# Patient Record
Sex: Male | Born: 1960 | Hispanic: No | Marital: Married | Smoking: Never smoker
Health system: Southern US, Community
[De-identification: ages and names within clinical notes are randomized; demographics above are authoritative.]

## PROBLEM LIST (undated history)

## (undated) DIAGNOSIS — J159 Unspecified bacterial pneumonia: Secondary | ICD-10-CM

## (undated) DIAGNOSIS — G4730 Sleep apnea, unspecified: Secondary | ICD-10-CM

## (undated) DIAGNOSIS — M1990 Unspecified osteoarthritis, unspecified site: Secondary | ICD-10-CM

## (undated) DIAGNOSIS — I313 Pericardial effusion (noninflammatory): Secondary | ICD-10-CM

## (undated) DIAGNOSIS — M51369 Other intervertebral disc degeneration, lumbar region without mention of lumbar back pain or lower extremity pain: Secondary | ICD-10-CM

## (undated) DIAGNOSIS — J309 Allergic rhinitis, unspecified: Secondary | ICD-10-CM

## (undated) DIAGNOSIS — I319 Disease of pericardium, unspecified: Secondary | ICD-10-CM

## (undated) DIAGNOSIS — M5136 Other intervertebral disc degeneration, lumbar region: Secondary | ICD-10-CM

## (undated) DIAGNOSIS — I10 Essential (primary) hypertension: Secondary | ICD-10-CM

## (undated) HISTORY — DX: Other intervertebral disc degeneration, lumbar region: M51.36

## (undated) HISTORY — DX: Other intervertebral disc degeneration, lumbar region without mention of lumbar back pain or lower extremity pain: M51.369

## (undated) HISTORY — PX: CHOLECYSTECTOMY: SHX55

## (undated) HISTORY — DX: Allergic rhinitis, unspecified: J30.9

---
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
## 2015-02-25 ENCOUNTER — Other Ambulatory Visit: Payer: Self-pay | Admitting: Physical Medicine and Rehabilitation

## 2015-02-25 DIAGNOSIS — M4800 Spinal stenosis, site unspecified: Secondary | ICD-10-CM

## 2015-03-08 ENCOUNTER — Other Ambulatory Visit: Payer: Self-pay

## 2016-12-10 ENCOUNTER — Encounter (HOSPITAL_BASED_OUTPATIENT_CLINIC_OR_DEPARTMENT_OTHER): Payer: Self-pay | Admitting: *Deleted

## 2016-12-13 ENCOUNTER — Ambulatory Visit: Payer: Self-pay | Admitting: Ophthalmology

## 2016-12-13 NOTE — H&P (Signed)
Date of examination:  11-29-16  Indication for surgery: to straighten the eyes and allow some binocularity  Pertinent past medical history:  Past Medical History:  Diagnosis Date  . Arthritis   . Hypertension   . Sleep apnea     Pertinent ocular history:  Hx strabismus surgery x3, most recently an adjustable for ET about 1993, gradual recurrence of ET over the years  Pertinent family history: No family history on file.  General:  Healthy appearing patient in no distress.    Eyes:    Acuity  cc OD 20/20  OS 20/20  External: Within normal limits     Anterior segment: Within normal limits x healed conj scars  Motility:   ET=20, increases in R gaze decreases in L gaze, 1-2- RLR.  ET' 22.  RHT 6 comitant  Fundus: deferred  Refraction:  Manifest Cycloplegic OD +1  OS -2.50  Heart: Regular rate and rhythm without murmur     Lungs: Clear to auscultation     Impression: Esotropia, recurrent (?consecutive), s/p strabismus surgery x 3, details unknown, horizontally incomitant  Plan: Explore at least the right lateral rectus muscle and left medial rectus muscle.  Tentatively planning to recess left medial rectus, ?adjustable, and may also (or instead) advance/resect right lateral rectus muscle, ? Adjustable.  If advancing/resecting RLR may explore RMR to make sure it has been recessed  Shara Blazing

## 2016-12-16 ENCOUNTER — Encounter (HOSPITAL_BASED_OUTPATIENT_CLINIC_OR_DEPARTMENT_OTHER)
Admission: RE | Admit: 2016-12-16 | Discharge: 2016-12-16 | Disposition: A | Discharge status: Home / Self Care | Payer: BLUE CROSS/BLUE SHIELD | Source: Ambulatory Visit | Attending: Ophthalmology | Admitting: Ophthalmology

## 2016-12-16 DIAGNOSIS — I10 Essential (primary) hypertension: Secondary | ICD-10-CM | POA: Diagnosis not present

## 2016-12-16 DIAGNOSIS — H5000 Unspecified esotropia: Secondary | ICD-10-CM | POA: Diagnosis not present

## 2016-12-16 DIAGNOSIS — E669 Obesity, unspecified: Secondary | ICD-10-CM | POA: Diagnosis not present

## 2016-12-16 DIAGNOSIS — G4730 Sleep apnea, unspecified: Secondary | ICD-10-CM | POA: Diagnosis not present

## 2016-12-16 DIAGNOSIS — Z6835 Body mass index (BMI) 35.0-35.9, adult: Secondary | ICD-10-CM | POA: Diagnosis not present

## 2016-12-16 DIAGNOSIS — H11241 Scarring of conjunctiva, right eye: Secondary | ICD-10-CM | POA: Diagnosis not present

## 2016-12-16 DIAGNOSIS — M1990 Unspecified osteoarthritis, unspecified site: Secondary | ICD-10-CM | POA: Diagnosis not present

## 2016-12-16 LAB — BASIC METABOLIC PANEL
Anion gap: 8 (ref 5–15)
BUN: 10 mg/dL (ref 6–20)
CO2: 28 mmol/L (ref 22–32)
Calcium: 8.9 mg/dL (ref 8.9–10.3)
Chloride: 102 mmol/L (ref 101–111)
Creatinine, Ser: 1.12 mg/dL (ref 0.61–1.24)
GFR calc Af Amer: >60 mL/min (ref >60)
GFR calc non Af Amer: >60 mL/min (ref >60)
Glucose, Bld: 106 mg/dL — ABNORMAL HIGH (ref 65–99)
Potassium: 3.5 mmol/L (ref 3.5–5.1)
Sodium: 138 mmol/L (ref 135–145)

## 2016-12-17 ENCOUNTER — Encounter (HOSPITAL_BASED_OUTPATIENT_CLINIC_OR_DEPARTMENT_OTHER)
Admission: RE | Disposition: A | Discharge status: Home / Self Care | Payer: Self-pay | Source: Ambulatory Visit | Attending: Ophthalmology

## 2016-12-17 ENCOUNTER — Encounter (HOSPITAL_BASED_OUTPATIENT_CLINIC_OR_DEPARTMENT_OTHER): Payer: Self-pay | Admitting: *Deleted

## 2016-12-17 ENCOUNTER — Ambulatory Visit (HOSPITAL_BASED_OUTPATIENT_CLINIC_OR_DEPARTMENT_OTHER): Payer: BLUE CROSS/BLUE SHIELD | Admitting: Anesthesiology

## 2016-12-17 ENCOUNTER — Ambulatory Visit (HOSPITAL_BASED_OUTPATIENT_CLINIC_OR_DEPARTMENT_OTHER)
Admission: RE | Admit: 2016-12-17 | Discharge: 2016-12-17 | Disposition: A | Discharge status: Home / Self Care | Payer: BLUE CROSS/BLUE SHIELD | Source: Ambulatory Visit | Attending: Ophthalmology | Admitting: Ophthalmology

## 2016-12-17 DIAGNOSIS — H11241 Scarring of conjunctiva, right eye: Secondary | ICD-10-CM | POA: Insufficient documentation

## 2016-12-17 DIAGNOSIS — I10 Essential (primary) hypertension: Secondary | ICD-10-CM | POA: Insufficient documentation

## 2016-12-17 DIAGNOSIS — H5000 Unspecified esotropia: Secondary | ICD-10-CM | POA: Insufficient documentation

## 2016-12-17 DIAGNOSIS — Z6835 Body mass index (BMI) 35.0-35.9, adult: Secondary | ICD-10-CM | POA: Insufficient documentation

## 2016-12-17 DIAGNOSIS — G4730 Sleep apnea, unspecified: Secondary | ICD-10-CM | POA: Insufficient documentation

## 2016-12-17 DIAGNOSIS — E669 Obesity, unspecified: Secondary | ICD-10-CM | POA: Insufficient documentation

## 2016-12-17 DIAGNOSIS — M1990 Unspecified osteoarthritis, unspecified site: Secondary | ICD-10-CM | POA: Insufficient documentation

## 2016-12-17 HISTORY — DX: Unspecified osteoarthritis, unspecified site: M19.90

## 2016-12-17 HISTORY — DX: Sleep apnea, unspecified: G47.30

## 2016-12-17 HISTORY — PX: STRABISMUS SURGERY: SHX218

## 2016-12-17 HISTORY — DX: Essential (primary) hypertension: I10

## 2016-12-17 SURGERY — STRABISMUS SURGERY, BILATERAL
Anesthesia: General | Site: Eye | Laterality: Bilateral

## 2016-12-17 MED ORDER — LIDOCAINE HCL (CARDIAC) 20 MG/ML IV SOLN
INTRAVENOUS | Status: DC | PRN
  Administered 2016-12-17: 100 mg via INTRAVENOUS
  Administered 2016-12-17: 50 mg via INTRAVENOUS

## 2016-12-17 MED ORDER — HYDROMORPHONE HCL 1 MG/ML IJ SOLN
0.2500 mg | INTRAMUSCULAR | Status: DC | PRN
  Administered 2016-12-17: 0.25 mg via INTRAVENOUS
  Administered 2016-12-17: 0.5 mg via INTRAVENOUS
  Administered 2016-12-17: 0.25 mg via INTRAVENOUS

## 2016-12-17 MED ORDER — TOBRAMYCIN-DEXAMETHASONE 0.3-0.1 % OP OINT: 1.0000 "application " | TOPICAL_OINTMENT | Freq: Two times a day (BID) | OPHTHALMIC | 0 refills | Status: DC

## 2016-12-17 MED ORDER — DEXAMETHASONE SODIUM PHOSPHATE 4 MG/ML IJ SOLN
INTRAMUSCULAR | Status: DC | PRN
  Administered 2016-12-17: 10 mg via INTRAVENOUS

## 2016-12-17 MED ORDER — SUCCINYLCHOLINE CHLORIDE 20 MG/ML IJ SOLN
INTRAMUSCULAR | Status: DC | PRN
  Administered 2016-12-17: 120 mg via INTRAVENOUS

## 2016-12-17 MED ORDER — HYDROMORPHONE HCL 1 MG/ML IJ SOLN
INTRAMUSCULAR | Status: AC
  Filled 2016-12-17: qty 0.5

## 2016-12-17 MED ORDER — TOBRAMYCIN-DEXAMETHASONE 0.3-0.1 % OP OINT
TOPICAL_OINTMENT | OPHTHALMIC | Status: AC
  Filled 2016-12-17: qty 3.5

## 2016-12-17 MED ORDER — TOBRAMYCIN-DEXAMETHASONE 0.3-0.1 % OP OINT
TOPICAL_OINTMENT | OPHTHALMIC | Status: DC | PRN
  Administered 2016-12-17: 1 via OPHTHALMIC

## 2016-12-17 MED ORDER — FENTANYL CITRATE (PF) 100 MCG/2ML IJ SOLN
50.0000 ug | INTRAMUSCULAR | Status: AC | PRN
  Administered 2016-12-17: 100 ug via INTRAVENOUS
  Administered 2016-12-17 (×2): 50 ug via INTRAVENOUS

## 2016-12-17 MED ORDER — PROPOFOL 10 MG/ML IV BOLUS
INTRAVENOUS | Status: AC
  Filled 2016-12-17: qty 20

## 2016-12-17 MED ORDER — EPHEDRINE SULFATE 50 MG/ML IJ SOLN
INTRAMUSCULAR | Status: DC | PRN
  Administered 2016-12-17: 15 mg via INTRAVENOUS

## 2016-12-17 MED ORDER — PHENYLEPHRINE 40 MCG/ML (10ML) SYRINGE FOR IV PUSH (FOR BLOOD PRESSURE SUPPORT)
PREFILLED_SYRINGE | INTRAVENOUS | Status: DC | PRN
  Administered 2016-12-17: 80 ug via INTRAVENOUS

## 2016-12-17 MED ORDER — FENTANYL CITRATE (PF) 100 MCG/2ML IJ SOLN
INTRAMUSCULAR | Status: AC
  Filled 2016-12-17: qty 2

## 2016-12-17 MED ORDER — PROPOFOL 10 MG/ML IV BOLUS
INTRAVENOUS | Status: DC | PRN
  Administered 2016-12-17: 300 mg via INTRAVENOUS

## 2016-12-17 MED ORDER — KETOROLAC TROMETHAMINE 30 MG/ML IJ SOLN
INTRAMUSCULAR | Status: DC | PRN
  Administered 2016-12-17: 30 mg via INTRAVENOUS

## 2016-12-17 MED ORDER — MIDAZOLAM HCL 2 MG/2ML IJ SOLN
1.0000 mg | INTRAMUSCULAR | Status: DC | PRN
  Administered 2016-12-17: 2 mg via INTRAVENOUS

## 2016-12-17 MED ORDER — SCOPOLAMINE 1 MG/3DAYS TD PT72: 1.0000 | MEDICATED_PATCH | Freq: Once | TRANSDERMAL | Status: DC | PRN

## 2016-12-17 MED ORDER — PROMETHAZINE HCL 25 MG/ML IJ SOLN: 6.2500 mg | INTRAMUSCULAR | Status: DC | PRN

## 2016-12-17 MED ORDER — GLYCOPYRROLATE 0.2 MG/ML IJ SOLN
INTRAMUSCULAR | Status: DC | PRN
  Administered 2016-12-17: 0.3 mg via INTRAVENOUS

## 2016-12-17 MED ORDER — BSS IO SOLN
INTRAOCULAR | Status: DC | PRN
  Administered 2016-12-17: 1

## 2016-12-17 MED ORDER — LACTATED RINGERS IV SOLN
INTRAVENOUS | Status: DC
  Administered 2016-12-17 (×2): via INTRAVENOUS

## 2016-12-17 MED ORDER — MIDAZOLAM HCL 2 MG/2ML IJ SOLN
INTRAMUSCULAR | Status: AC
  Filled 2016-12-17: qty 2

## 2016-12-17 SURGICAL SUPPLY — 29 items
APPLICATOR COTTON TIP 6IN STRL (MISCELLANEOUS) ×8 IMPLANT
APPLICATOR DR MATTHEWS STRL (MISCELLANEOUS) ×4 IMPLANT
BANDAGE EYE OVAL (MISCELLANEOUS) IMPLANT
CAUTERY EYE LOW TEMP 1300F FIN (OPHTHALMIC RELATED) IMPLANT
COVER BACK TABLE 60X90IN (DRAPES) ×2 IMPLANT
COVER MAYO STAND STRL (DRAPES) ×2 IMPLANT
DRAPE SURG 17X23 STRL (DRAPES) ×8 IMPLANT
DRAPE U-SHAPE 76X120 STRL (DRAPES) ×4 IMPLANT
GLOVE BIO SURGEON STRL SZ 6.5 (GLOVE) ×4 IMPLANT
GLOVE BIOGEL M STRL SZ7.5 (GLOVE) ×2 IMPLANT
GOWN STRL REUS W/ TWL LRG LVL3 (GOWN DISPOSABLE) ×1 IMPLANT
GOWN STRL REUS W/TWL LRG LVL3 (GOWN DISPOSABLE) ×1
GOWN STRL REUS W/TWL XL LVL3 (GOWN DISPOSABLE) ×2 IMPLANT
NS IRRIG 1000ML POUR BTL (IV SOLUTION) ×2 IMPLANT
PACK BASIN DAY SURGERY FS (CUSTOM PROCEDURE TRAY) ×2 IMPLANT
SHEET MEDIUM DRAPE 40X70 STRL (DRAPES) IMPLANT
SPEAR EYE SURG WECK-CEL (MISCELLANEOUS) ×4 IMPLANT
STRIP CLOSURE SKIN 1/4X4 (GAUZE/BANDAGES/DRESSINGS) IMPLANT
SUT 6 0 SILK T G140 8DA (SUTURE) ×2 IMPLANT
SUT MERSILENE 6-0 18IN S14 8MM (SUTURE)
SUT PLAIN 6 0 TG1408 (SUTURE) ×2 IMPLANT
SUT SILK 4 0 C 3 735G (SUTURE) IMPLANT
SUT VICRYL 6 0 S 28 (SUTURE) IMPLANT
SUT VICRYL ABS 6-0 S29 18IN (SUTURE) ×4 IMPLANT
SUTURE MERSLN 6-0 18IN S14 8MM (SUTURE) IMPLANT
SYR 10ML LL (SYRINGE) ×2 IMPLANT
SYR TB 1ML LL NO SAFETY (SYRINGE) ×2 IMPLANT
TOWEL OR 17X24 6PK STRL BLUE (TOWEL DISPOSABLE) ×2 IMPLANT
TRAY DSU PREP LF (CUSTOM PROCEDURE TRAY) ×4 IMPLANT

## 2016-12-17 NOTE — Addendum Note (Signed)
Addendum  created 12/17/16 1456 by Ronnette HilaPayne, Tanay Massiah D, CRNA   Anesthesia Intra Flowsheets edited

## 2016-12-17 NOTE — Discharge Instructions (Signed)
° ° °  Post Anesthesia Home Care Instructions ° °Activity: °Get plenty of rest for the remainder of the day. A responsible individual must stay with you for 24 hours following the procedure.  °For the next 24 hours, DO NOT: °-Drive a car °-Operate machinery °-Drink alcoholic beverages °-Take any medication unless instructed by your physician °-Make any legal decisions or sign important papers. ° °Meals: °Start with liquid foods such as gelatin or soup. Progress to regular foods as tolerated. Avoid greasy, spicy, heavy foods. If nausea and/or vomiting occur, drink only clear liquids until the nausea and/or vomiting subsides. Call your physician if vomiting continues. ° °Special Instructions/Symptoms: °Your throat may feel dry or sore from the anesthesia or the breathing tube placed in your throat during surgery. If this causes discomfort, gargle with warm salt water. The discomfort should disappear within 24 hours. ° °If you had a scopolamine patch placed behind your ear for the management of post- operative nausea and/or vomiting: ° °1. The medication in the patch is effective for 72 hours, after which it should be removed.  Wrap patch in a tissue and discard in the trash. Wash hands thoroughly with soap and water. °2. You may remove the patch earlier than 72 hours if you experience unpleasant side effects which may include dry mouth, dizziness or visual disturbances. °3. Avoid touching the patch. Wash your hands with soap and water after contact with the patch. °  ° ° ° °Diet: Clear liquids, advance to soft foods then regular diet as tolerated by this evening. ° °Pain control:  ° 1)  Ibuprofen 600 mg by mouth every 6-8 hours as needed for pain ° 2)  Ice pack/cold compress to operated eye(s) as desired ° °Eye medications:   Tobradex or Zylet eye ointment 1/2 inch in operated eye(s) twice a day  ° °Activity: No swimming for 1 week.  It is OK to let water run over the face and eyes while showering or taking a bath, even  during the first week.  No other restriction on exercise or activity. ° °Call Dr. Young's office [phone] with any problems or concerns. °

## 2016-12-17 NOTE — Anesthesia Procedure Notes (Signed)
Performed by: Jahzion Brogden D       

## 2016-12-17 NOTE — Op Note (Signed)
12/17/2016  12:12 PM  PATIENT:  Justin Mayer    PRE-OPERATIVE DIAGNOSIS:  ESOTROPIA  POST-OPERATIVE DIAGNOSIS:  1.  Esotropia      2. Slipped right lateral rectus muscle  PROCEDURE:  1. Exploration of right lateral rectus muscle.   2. Left medial rectus muscle recession 6.0 mm  SURGEON:  Shara BlazingYOUNG,Justin Dimaano O, MD  ANESTHESIA:   General  COMPLICATIONS: one  OPERATIVE PROCEDURE: After preoperative evaluation including informed consent, the patient was taken to the operating room where he was identified by me.  General anesthesia was induced without difficulty after placement of appropriate monitors.  The patient was prepped and draped in standard sterile fashion.  A lid speculum was placed in the right eye.  Forced ductions showed no limitation to forced abduction of the right eye.  A 6-0 silk traction suture was placed at the temporal limbus of the right eye.  Significant conjunctival scarring was seen temporally in the right eye especially beginning about 6 mm posterior to the limbus.  A Swan incision was made in conjunctiva approximately 7 mm posterior to the limbus, over the original insertion of the lateral rectus muscle.  An attempt was made to hook the muscle from the superior side, so as to avoid the inferior oblique.  The tissue that was hooked was found to be quite lax.  Careful dissection was carried out, revealing that the tissue on hook was quite thin, consistent with muscle capsule, not muscle.  Dissection was carried out posteriorly.  Approximately 20 mm posterior to the limbus thicker, more substantial tissue was encountered; I could not clearly identify this as muscle tissue.  When pulled it could not be advanced.  It did not appear to be in the capsule.  I did secured with a 6-0 Vicryl suture and attached it to sclera in case it was muscle, because the existing capsule was so thin.  Conjunctiva was closed with 3 6-0 plain gut sutures.  Attention was directed to the left eye,  where via an inferonasal fornix incision through conjunctiva and Tenon's fascia, the left medial rectus muscle was engaged on a series of muscle hooks and cleared of its fascial attachments.  Conjunctiva showed mild scarring scarring, consistent with a previous procedure the muscle was found inserted about 7 mm posterior to the limbus.  The tendon was secured with a double-armed 6-0 Vicryl suture, with a locking bite at each border of the muscle, 1 mm posterior to the current insertion.  The muscle was disinserted, it was reattached to sclera at a measured distance of 6.0 mm posterior to the current insertion, using direct scleral passes and crossed swords fashion.  The suture ends were tied securely after the position of the muscle and then checked and found to be accurate.  Conjunctiva was closed with a 2 6-0 plain gut sutures.  TobraDex ophthalmic ointment was placed in each eye.  The patient was awakened without difficulty and taken to the recovery room in stable condition, having suffered no intraoperative or immediate postoperative complications.  Shara BlazingYOUNG,Justin Fleagle O, MD

## 2016-12-17 NOTE — Interval H&P Note (Signed)
History and Physical Interval Note:  12/17/2016 10:11 AM  Justin GillsStephen Graham Zinni  has presented today for surgery, with the diagnosis of ESOTROPIA  The various methods of treatment have been discussed with the patient and family. After consideration of risks, benefits and other options for treatment, the patient has consented to  Procedure(s): REPAIR STRABISMUS BILATERAL (Bilateral) as a surgical intervention .  The patient's history has been reviewed, patient examined, no change in status, stable for surgery.  I have reviewed the patient's chart and labs.  Questions were answered to the patient's satisfaction.     Shara BlazingYOUNG,Hallis Meditz O

## 2016-12-17 NOTE — Anesthesia Postprocedure Evaluation (Signed)
Anesthesia Post Note  Patient: Justin Mayer  Procedure(s) Performed: BILATERAL STRABISMUS REPAIR (Bilateral Eye)     Patient location during evaluation: PACU Anesthesia Type: General Level of consciousness: sedated Pain management: pain level controlled Vital Signs Assessment: post-procedure vital signs reviewed and stable Respiratory status: spontaneous breathing and respiratory function stable Cardiovascular status: stable Postop Assessment: no apparent nausea or vomiting Anesthetic complications: no    Last Vitals:  Vitals:   12/17/16 1300 12/17/16 1330  BP: 130/86 140/90  Pulse: (!) 104 90  Resp: 14 18  Temp:  37 C  SpO2: 98% 96%    Last Pain:  Vitals:   12/17/16 1330  TempSrc:   PainSc: 1                  Brian Zeitlin DANIEL

## 2016-12-17 NOTE — Transfer of Care (Signed)
Immediate Anesthesia Transfer of Care Note  Patient: Jon GillsStephen Graham Rinck  Procedure(s) Performed: BILATERAL STRABISMUS REPAIR (Bilateral Eye)  Patient Location: PACU  Anesthesia Type:General  Level of Consciousness: awake, alert  and oriented  Airway & Oxygen Therapy: Patient Spontanous Breathing and Patient connected to face mask oxygen  Post-op Assessment: Report given to RN and Post -op Vital signs reviewed and stable  Post vital signs: Reviewed and stable  Last Vitals:  Vitals:   12/17/16 0905  BP: (!) 144/95  Pulse: 81  Resp: 20  Temp: 36.8 C  SpO2: 100%    Last Pain:  Vitals:   12/17/16 0905  TempSrc: Oral         Complications: No apparent anesthesia complications

## 2016-12-17 NOTE — H&P (View-Only) (Signed)
Date of examination:  11-29-16  Indication for surgery: to straighten the eyes and allow some binocularity  Pertinent past medical history:  Past Medical History:  Diagnosis Date  . Arthritis   . Hypertension   . Sleep apnea     Pertinent ocular history:  Hx strabismus surgery x3, most recently an adjustable for ET about 1993, gradual recurrence of ET over the years  Pertinent family history: No family history on file.  General:  Healthy appearing patient in no distress.    Eyes:    Acuity  cc OD 20/20  OS 20/20  External: Within normal limits     Anterior segment: Within normal limits x healed conj scars  Motility:   ET=20, increases in R gaze decreases in L gaze, 1-2- RLR.  ET' 22.  RHT 6 comitant  Fundus: deferred  Refraction:  Manifest Cycloplegic OD +1  OS -2.50  Heart: Regular rate and rhythm without murmur     Lungs: Clear to auscultation     Impression: Esotropia, recurrent (?consecutive), s/p strabismus surgery x 3, details unknown, horizontally incomitant  Plan: Explore at least the right lateral rectus muscle and left medial rectus muscle.  Tentatively planning to recess left medial rectus, ?adjustable, and may also (or instead) advance/resect right lateral rectus muscle, ? Adjustable.  If advancing/resecting RLR may explore RMR to make sure it has been recessed  Shara BlazingYOUNG,Imane Burrough O

## 2016-12-17 NOTE — Anesthesia Procedure Notes (Signed)
Procedure Name: LMA Insertion Date/Time: 12/17/2016 10:37 AM Performed by: Zenia ResidesPAYNE, Adrine Hayworth D Pre-anesthesia Checklist: Patient identified, Emergency Drugs available, Suction available and Patient being monitored Patient Re-evaluated:Patient Re-evaluated prior to induction Oxygen Delivery Method: Circle system utilized Preoxygenation: Pre-oxygenation with 100% oxygen Induction Type: IV induction Ventilation: Mask ventilation without difficulty LMA: LMA flexible inserted LMA Size: 5.0 Number of attempts: 1 Airway Equipment and Method: Bite block Placement Confirmation: positive ETCO2 Tube secured with: Tape Dental Injury: Teeth and Oropharynx as per pre-operative assessment

## 2016-12-17 NOTE — Anesthesia Procedure Notes (Addendum)
Procedure Name: Intubation Date/Time: 12/17/2016 10:58 AM Performed by: Melynda Ripple D Pre-anesthesia Checklist: Patient identified, Emergency Drugs available, Suction available and Patient being monitored Patient Re-evaluated:Patient Re-evaluated prior to induction Oxygen Delivery Method: Circle system utilized Preoxygenation: Pre-oxygenation with 100% oxygen Induction Type: IV induction Ventilation: Mask ventilation without difficulty Laryngoscope Size: Mac and 3 Grade View: Grade I Tube type: Oral Tube size: 7.0 mm Number of attempts: 1 Airway Equipment and Method: Stylet and Oral airway Placement Confirmation: ETT inserted through vocal cords under direct vision,  positive ETCO2 and breath sounds checked- equal and bilateral Secured at: 23 cm Tube secured with: Tape Dental Injury: Teeth and Oropharynx as per pre-operative assessment

## 2016-12-17 NOTE — Anesthesia Preprocedure Evaluation (Signed)
Anesthesia Evaluation  Patient identified by MRN, date of birth, ID band Patient awake    Reviewed: Allergy & Precautions, NPO status , Patient's Chart, lab work & pertinent test results  Airway Mallampati: II  TM Distance: >3 FB Neck ROM: Full    Dental no notable dental hx.    Pulmonary sleep apnea ,    Pulmonary exam normal breath sounds clear to auscultation       Cardiovascular hypertension, Pt. on medications Normal cardiovascular exam Rhythm:Regular Rate:Normal     Neuro/Psych negative neurological ROS  negative psych ROS   GI/Hepatic negative GI ROS, Neg liver ROS,   Endo/Other  negative endocrine ROS  Renal/GU negative Renal ROS     Musculoskeletal negative musculoskeletal ROS (+) Arthritis ,   Abdominal (+) + obese,   Peds  Hematology negative hematology ROS (+)   Anesthesia Other Findings   Reproductive/Obstetrics                             Anesthesia Physical Anesthesia Plan  ASA: II  Anesthesia Plan: General   Post-op Pain Management:    Induction: Intravenous  PONV Risk Score and Plan: 3 and Ondansetron, Dexamethasone and Midazolam  Airway Management Planned:   Additional Equipment:   Intra-op Plan:   Post-operative Plan: Extubation in OR  Informed Consent: I have reviewed the patients History and Physical, chart, labs and discussed the procedure including the risks, benefits and alternatives for the proposed anesthesia with the patient or authorized representative who has indicated his/her understanding and acceptance.   Dental advisory given  Plan Discussed with: CRNA  Anesthesia Plan Comments:         Anesthesia Quick Evaluation

## 2016-12-20 ENCOUNTER — Encounter (HOSPITAL_BASED_OUTPATIENT_CLINIC_OR_DEPARTMENT_OTHER): Payer: Self-pay | Admitting: Ophthalmology

## 2016-12-20 NOTE — Addendum Note (Signed)
Addendum  created 12/20/16 0820 by Pampa DesanctisLinka, Sadey Yandell L, CRNA   Charge Capture section accepted

## 2017-04-27 ENCOUNTER — Other Ambulatory Visit: Payer: Self-pay

## 2017-04-27 ENCOUNTER — Emergency Department (HOSPITAL_COMMUNITY): Payer: BLUE CROSS/BLUE SHIELD

## 2017-04-27 ENCOUNTER — Encounter (HOSPITAL_COMMUNITY): Payer: Self-pay | Admitting: Emergency Medicine

## 2017-04-27 ENCOUNTER — Emergency Department (HOSPITAL_COMMUNITY)
Admission: EM | Admit: 2017-04-27 | Discharge: 2017-04-28 | Disposition: A | Discharge status: Home / Self Care | Payer: BLUE CROSS/BLUE SHIELD | Attending: Emergency Medicine | Admitting: Emergency Medicine

## 2017-04-27 DIAGNOSIS — T502X5A Adverse effect of carbonic-anhydrase inhibitors, benzothiadiazides and other diuretics, initial encounter: Secondary | ICD-10-CM | POA: Diagnosis not present

## 2017-04-27 DIAGNOSIS — I10 Essential (primary) hypertension: Secondary | ICD-10-CM | POA: Diagnosis not present

## 2017-04-27 DIAGNOSIS — Z79899 Other long term (current) drug therapy: Secondary | ICD-10-CM | POA: Insufficient documentation

## 2017-04-27 DIAGNOSIS — R69 Illness, unspecified: Secondary | ICD-10-CM

## 2017-04-27 DIAGNOSIS — J111 Influenza due to unidentified influenza virus with other respiratory manifestations: Secondary | ICD-10-CM | POA: Diagnosis not present

## 2017-04-27 DIAGNOSIS — R509 Fever, unspecified: Secondary | ICD-10-CM | POA: Diagnosis present

## 2017-04-27 DIAGNOSIS — E876 Hypokalemia: Secondary | ICD-10-CM

## 2017-04-27 LAB — PROTIME-INR
INR: 1
Prothrombin Time: 13.1 seconds (ref 11.4–15.2)

## 2017-04-27 LAB — COMPREHENSIVE METABOLIC PANEL
ALT: 38 U/L (ref 17–63)
AST: 33 U/L (ref 15–41)
Albumin: 3.5 g/dL (ref 3.5–5.0)
Alkaline Phosphatase: 49 U/L (ref 38–126)
Anion gap: 10 (ref 5–15)
BUN: 11 mg/dL (ref 6–20)
CO2: 25 mmol/L (ref 22–32)
Calcium: 9.3 mg/dL (ref 8.9–10.3)
Chloride: 102 mmol/L (ref 101–111)
Creatinine, Ser: 1.31 mg/dL — ABNORMAL HIGH (ref 0.61–1.24)
GFR calc Af Amer: >60 mL/min (ref >60)
GFR calc non Af Amer: 60 mL/min — ABNORMAL LOW (ref >60)
Glucose, Bld: 117 mg/dL — ABNORMAL HIGH (ref 65–99)
Potassium: 3.2 mmol/L — ABNORMAL LOW (ref 3.5–5.1)
Sodium: 137 mmol/L (ref 135–145)
Total Bilirubin: 0.8 mg/dL (ref 0.3–1.2)
Total Protein: 6.8 g/dL (ref 6.5–8.1)

## 2017-04-27 LAB — CBC WITH DIFFERENTIAL/PLATELET
Basophils Absolute: 0 10*3/uL (ref 0.0–0.1)
Basophils Relative: 0 %
Eosinophils Absolute: 0 10*3/uL (ref 0.0–0.7)
Eosinophils Relative: 0 %
HCT: 37.2 % — ABNORMAL LOW (ref 39.0–52.0)
Hemoglobin: 13 g/dL (ref 13.0–17.0)
Lymphocytes Relative: 4 %
Lymphs Abs: 0.7 10*3/uL (ref 0.7–4.0)
MCH: 30.7 pg (ref 26.0–34.0)
MCHC: 34.9 g/dL (ref 30.0–36.0)
MCV: 87.9 fL (ref 78.0–100.0)
Monocytes Absolute: 1.8 10*3/uL — ABNORMAL HIGH (ref 0.1–1.0)
Monocytes Relative: 11 %
Neutro Abs: 13.9 10*3/uL — ABNORMAL HIGH (ref 1.7–7.7)
Neutrophils Relative %: 85 %
Platelets: 269 10*3/uL (ref 150–400)
RBC: 4.23 MIL/uL (ref 4.22–5.81)
RDW: 12.6 % (ref 11.5–15.5)
WBC: 16.4 10*3/uL — ABNORMAL HIGH (ref 4.0–10.5)

## 2017-04-27 LAB — I-STAT CG4 LACTIC ACID, ED
Lactic Acid, Venous: 1.53 mmol/L (ref 0.5–1.9)
Lactic Acid, Venous: 2.34 mmol/L (ref 0.5–1.9)

## 2017-04-27 LAB — CBG MONITORING, ED: Glucose-Capillary: 154 mg/dL — ABNORMAL HIGH (ref 65–99)

## 2017-04-27 LAB — INFLUENZA PANEL BY PCR (TYPE A & B)
Influenza A By PCR: NEGATIVE
Influenza B By PCR: NEGATIVE

## 2017-04-27 LAB — RAPID STREP SCREEN (MED CTR MEBANE ONLY): Streptococcus, Group A Screen (Direct): NEGATIVE

## 2017-04-27 MED ORDER — POTASSIUM CHLORIDE CRYS ER 20 MEQ PO TBCR
40.0000 meq | EXTENDED_RELEASE_TABLET | Freq: Once | ORAL | Status: AC
  Administered 2017-04-27: 40 meq via ORAL
  Filled 2017-04-27: qty 2

## 2017-04-27 NOTE — ED Triage Notes (Signed)
Patient states that he has been having a fever with polyuria for the last week.  States that he is a [employer] and has been having chills and fever off and on for a week, with polyuria.  He states he is feeling "off".  No vomiting or nausea.  He does have a bit of a scratchy throat.  He states that he is not having the polyuria at this time, but he is having some back pain.

## 2017-04-27 NOTE — ED Notes (Signed)
Called x3 for room no reply.

## 2017-04-27 NOTE — ED Notes (Signed)
Made aware of elevated Lactic Acid will continue to monitor.

## 2017-04-27 NOTE — ED Provider Notes (Signed)
[facility] EMERGENCY DEPARTMENT Provider Note   CSN: [id number] Arrival date & time: 04/27/17  2147     History   Chief Complaint Chief Complaint  Patient presents with  . Fever  . Polyuria    HPI Justin Mayer is a 57 y.o. male.  The history is provided by the patient.  He has history of hypertension and sleep apnea and comes in because of fever and chills today.  Temperature was 102.1.  He has minimal rhinorrhea and minimal cough.  He denies any sore throat.  There is been no vomiting or diarrhea.  He does feel mildly achy.  He denies abdominal pain or flank pain or dysuria or polyuria.  However, he had similar episodes with subjective fever and chills 5 days ago and approximately 10 days ago.  With each of those episodes, he felt somewhat lightheaded and took some ibuprofen and then felt fine.  With the first episode, he had urinary frequency but no dysuria or urgency.  He has a history of kidney stones, but states this is distinctly different from what he has had with his kidney stones.  He denies any sick contacts.  Past Medical History:  Diagnosis Date  . Arthritis   . Hypertension   . Sleep apnea     There are no active problems to display for this patient.   Past Surgical History:  Procedure Laterality Date  . CHOLECYSTECTOMY    . STRABISMUS SURGERY Bilateral 12/17/2016   Procedure: BILATERAL STRABISMUS REPAIR;  Surgeon: Verne Carrow, MD;  Location:  SURGERY CENTER;  Service: Ophthalmology;  Laterality: Bilateral;       Home Medications    Prior to Admission medications   Medication Sig Start Date End Date Taking? Authorizing Provider  aspirin 81 MG chewable tablet Chew by mouth daily.    [provider]  atenolol (TENORMIN) 50 MG tablet Take 50 mg by mouth daily.    [provider]  glucosamine-chondroitin 500-400 MG tablet Take 1 tablet by mouth 3 (three) times daily.    [provider]  Iron  Combinations (CHROMAGEN) capsule Take 1 capsule by mouth daily.    [provider]  lisinopril-hydrochlorothiazide (PRINZIDE,ZESTORETIC) 20-25 MG tablet Take 1 tablet by mouth daily.    [provider]  tobramycin-dexamethasone Wallene Dales) ophthalmic ointment Place 1 application into both eyes 2 (two) times daily. 12/17/16   Verne Carrow, MD  zinc gluconate 50 MG tablet Take 50 mg by mouth daily.    [provider]    Family History No family history on file.  Social History Social History   Tobacco Use  . Smoking status: Never Smoker  . Smokeless tobacco: Never Used  Substance Use Topics  . Alcohol use: No  . Drug use: No     Allergies   Patient has no known allergies.   Review of Systems Review of Systems  All other systems reviewed and are negative.    Physical Exam Updated Vital Signs BP 121/79 (BP Location: Right Arm)   Pulse (!) 118   Temp 99.7 F (37.6 C) (Oral)   Resp 18   SpO2 99%   Physical Exam  Nursing note and vitals reviewed.  57 year old male, resting comfortably and in no acute distress. Vital signs are significant for elevated heart rate. Oxygen saturation is 99%, which is normal. Head is normocephalic and atraumatic. PERRLA, EOMI. Oropharynx is clear. Neck is nontender and supple without adenopathy or JVD. Back is nontender  and there is no CVA tenderness. Lungs are clear without rales, wheezes, or rhonchi. Chest is nontender. Heart has regular rate and rhythm without murmur. Abdomen is soft, flat, nontender without masses or hepatosplenomegaly and peristalsis is normoactive. Extremities have no cyanosis or edema, full range of motion is present. Skin is warm and dry without rash. Neurologic: Mental status is normal, cranial nerves are intact, there are no motor or sensory deficits.  ED Treatments / Results  Labs (all labs ordered are listed, but only abnormal results are displayed) Labs Reviewed  COMPREHENSIVE  METABOLIC PANEL - Abnormal; Notable for the following components:      Result Value   Potassium 3.2 (*)    Glucose, Bld 117 (*)    Creatinine, Ser 1.31 (*)    GFR calc non Af Amer 60 (*)    All other components within normal limits  CBC WITH DIFFERENTIAL/PLATELET - Abnormal; Notable for the following components:   WBC 16.4 (*)    HCT 37.2 (*)    Neutro Abs 13.9 (*)    Monocytes Absolute 1.8 (*)    All other components within normal limits  I-STAT CG4 LACTIC ACID, ED - Abnormal; Notable for the following components:   Lactic Acid, Venous 2.34 (*)    All other components within normal limits  CBG MONITORING, ED - Abnormal; Notable for the following components:   Glucose-Capillary 154 (*)    All other components within normal limits  RAPID STREP SCREEN (NOT AT [facility])  CULTURE, BLOOD (ROUTINE X 2)  CULTURE, BLOOD (ROUTINE X 2)  CULTURE, GROUP A STREP ([facility])  PROTIME-INR  URINALYSIS, ROUTINE W REFLEX MICROSCOPIC  INFLUENZA PANEL BY PCR (TYPE A & B)  I-STAT CG4 LACTIC ACID, ED   Radiology Dg Chest 2 View  Result Date: 04/27/2017 CLINICAL DATA:  Fever off and on for 8 days. EXAM: CHEST  2 VIEW COMPARISON:  None. FINDINGS: Heart and mediastinal contours are within normal limits. No focal opacities or effusions. No acute bony abnormality. Mild peribronchial thickening. IMPRESSION: Mild bronchitic changes. Electronically Signed   By: Charlett Nose M.D.   On: 04/27/2017 22:28    Procedures Procedures  Medications Ordered in ED Medications  potassium chloride SA (K-DUR,KLOR-CON) CR tablet 40 mEq (40 mEq Oral Given 04/27/17 2359)  acetaminophen (TYLENOL) tablet 650 mg (650 mg Oral Given 04/28/17 0017)  sodium chloride 0.9 % bolus 1,000 mL (1,000 mLs Intravenous New Bag/Given 04/28/17 0022)     Initial Impression / Assessment and Plan / ED Course  I have reviewed the triage vital signs and the nursing notes.  Pertinent labs & imaging results that were available during my care of the  patient were reviewed by me and considered in my medical decision making (see chart for details).  3 separate episodes of chills and body aches with one episode documented to include fever.  Initial episode had some urinary symptoms.  Today, chest x-ray shows no acute changes, strep screen and influenza screen are negative.  WBC is mildly elevated and lactic acid level is mildly elevated, but the patient does not appear septic.  Urinalysis is pending.  Labs also show mild hypokalemia which is presumably secondary to hydrochlorothiazide that he takes for blood pressure control.  He is given a dose of oral potassium.  Old records are reviewed, and he has no relevant past visits.  Urinalysis shows no evidence of infection.  Temperature persists elevated, and he continues with mild tachycardia.  He is given IV fluids and acetaminophen  following which heart rate is down to 105.  He is discharged with prescription for K Dur.  Advised to follow-up with primary care provider if cyclical fever persists.  Final Clinical Impressions(s) / ED Diagnoses   Final diagnoses:  Influenza-like illness  Diuretic-induced hypokalemia    ED Discharge Orders        Ordered    potassium chloride SA (K-DUR,KLOR-CON) 20 MEQ tablet  Daily     04/28/17 0314       Dione BoozeGlick, Tal Kempker, MD 04/28/17 [phone]

## 2017-04-28 ENCOUNTER — Telehealth (HOSPITAL_BASED_OUTPATIENT_CLINIC_OR_DEPARTMENT_OTHER): Payer: Self-pay | Admitting: Emergency Medicine

## 2017-04-28 LAB — BLOOD CULTURE ID PANEL (REFLEXED)

## 2017-04-28 LAB — URINALYSIS, ROUTINE W REFLEX MICROSCOPIC
Bilirubin Urine: NEGATIVE
Glucose, UA: NEGATIVE mg/dL
Hgb urine dipstick: NEGATIVE
Ketones, ur: NEGATIVE mg/dL
Leukocytes, UA: NEGATIVE
Nitrite: NEGATIVE
Protein, ur: NEGATIVE mg/dL
Specific Gravity, Urine: 1.006 (ref 1.005–1.030)
pH: 6 (ref 5.0–8.0)

## 2017-04-28 MED ORDER — ACETAMINOPHEN 325 MG PO TABS
650.0000 mg | ORAL_TABLET | Freq: Once | ORAL | Status: AC
  Administered 2017-04-28: 650 mg via ORAL
  Filled 2017-04-28: qty 2

## 2017-04-28 MED ORDER — SODIUM CHLORIDE 0.9 % IV BOLUS (SEPSIS)
1000.0000 mL | Freq: Once | INTRAVENOUS | Status: AC
  Administered 2017-04-28: 1000 mL via INTRAVENOUS

## 2017-04-28 MED ORDER — POTASSIUM CHLORIDE CRYS ER 20 MEQ PO TBCR: 20.0000 meq | EXTENDED_RELEASE_TABLET | Freq: Every day | ORAL | 0 refills | Status: DC

## 2017-04-28 NOTE — Discharge Instructions (Signed)
Drink plenty of fluids. Take acetaminophen and ibuprofen as needed for fever. If periodic fever persists, see your doctor for additional tests, or referral to a specialist. Return if your symptoms are getting worse.

## 2017-04-28 NOTE — Telephone Encounter (Signed)
Received phone call of positive blood culture of gram positive cocci. Results given to Dr Anitra LauthPlunkett. No new orders.

## 2017-04-28 NOTE — ED Notes (Signed)
PT states understanding of care given, follow up care, and medication prescribed. PT ambulated from ED to car with a steady gait. 

## 2017-04-30 LAB — CULTURE, GROUP A STREP (THRC)

## 2017-04-30 LAB — CULTURE, BLOOD (ROUTINE X 2): Special Requests: ADEQUATE

## 2017-05-01 ENCOUNTER — Telehealth: Payer: Self-pay

## 2017-05-01 NOTE — Telephone Encounter (Signed)
Post ED Visit - Positive Culture Follow-up: Unsuccessful Patient Follow-up  Culture assessed and recommendations reviewed by:  []  Enzo BiNathan Batchelder, Pharm.D. []  Celedonio MiyamotoJeremy Frens, Pharm.D., BCPS AQ-ID []  Garvin FilaMike Maccia, Pharm.D., BCPS [x]  Georgina PillionElizabeth Martin, [address]Ph[address]D., BCPS []  GuysMinh Pham, [address]Ph[address]D., BCPS, AAHIVP []  Estella HuskMichelle Turner, Pharm.D., BCPS, AAHIVP []  Lysle Pearlachel Rumbarger, PharmD, BCPS []  Casilda Carlsaylor Stone, PharmD, BCPS []  Pollyann SamplesAndy Johnston, PharmD, BCPS  Positive [facility] culture Symptom check, May need abx []  Patient discharged without antimicrobial prescription and treatment is now indicated []  Organism is resistant to prescribed ED discharge antimicrobial []  Patient with positive blood cultures   Unable to contact patient after 3 attempts, letter will be sent to address on file  Jerry CarasCullom, Cora Brierley Burnett 05/01/2017, 1:14 PM

## 2017-05-01 NOTE — Progress Notes (Signed)
ED Antimicrobial Stewardship Positive Culture Follow Up   Justin Mayer is an 57 y.o. male who presented to [facility] on 04/27/2017 with a chief complaint of  Chief Complaint  Patient presents with  . Fever  . Polyuria    Recent Results (from the past 720 hour(s))  Rapid strep screen     Status: None   Collection Time: 04/27/17 10:05 PM  Result Value Ref Range Status   Streptococcus, Group A Screen (Direct) NEGATIVE NEGATIVE Final    Comment: (NOTE) A Rapid Antigen test may result negative if the antigen level in the sample is below the detection level of this test. The FDA has not cleared this test as a stand-alone test therefore the rapid antigen negative result has reflexed to a Group A Strep culture. Performed at [facility], [address] [place], [place] [address]   Culture, group A strep     Status: None   Collection Time: 04/27/17 10:05 PM  Result Value Ref Range Status   Specimen Description THROAT  Final   Special Requests NONE Reflexed from [id number]  Final   Culture   Final    NO GROUP A STREP (S.PYOGENES) ISOLATED Performed at [facility], [address] [place], [place] [address]    Report Status 04/30/2017 FINAL  Final  Culture, blood (Routine x 2)     Status: Abnormal   Collection Time: 04/27/17 11:25 PM  Result Value Ref Range Status   Specimen Description BLOOD RIGHT ANTECUBITAL  Final   Special Requests   Final    BOTTLES DRAWN AEROBIC AND ANAEROBIC Blood Culture adequate volume   Culture  Setup Time   Final    GRAM POSITIVE COCCI IN CHAINS AEROBIC BOTTLE ONLY CRITICAL RESULT CALLED TO, READ BACK BY AND VERIFIED WITH: Burnell BlanksM DAVIS RN [id number] 04/28/17 A BROWNING    Culture STREPTOCOCCUS GROUP C (A)  Final   Report Status 04/30/2017 FINAL  Final   Organism ID, Bacteria STREPTOCOCCUS GROUP C  Final      Susceptibility   Streptococcus group c - MIC*    CLINDAMYCIN <=0.25 SENSITIVE Sensitive     AMPICILLIN <=0.25 SENSITIVE Sensitive    ERYTHROMYCIN <=0.12 SENSITIVE Sensitive     VANCOMYCIN 0.25 SENSITIVE Sensitive     CEFTRIAXONE <=0.12      LEVOFLOXACIN 0.5 SENSITIVE Sensitive     PENICILLIN Value in next row Sensitive      SENSITIVE<=0.12    * STREPTOCOCCUS GROUP C  Blood Culture ID Panel (Reflexed)     Status: Abnormal   Collection Time: 04/27/17 11:25 PM  Result Value Ref Range Status   Enterococcus species NOT DETECTED NOT DETECTED Final   Listeria monocytogenes NOT DETECTED NOT DETECTED Final   Staphylococcus species NOT DETECTED NOT DETECTED Final   Staphylococcus aureus NOT DETECTED NOT DETECTED Final   Streptococcus species DETECTED (A) NOT DETECTED Final    Comment: Not Enterococcus species, Streptococcus agalactiae, Streptococcus pyogenes, or Streptococcus pneumoniae. CRITICAL RESULT CALLED TO, READ BACK BY AND VERIFIED WITH: Burnell BlanksM DAVIS RN [id number] 04/28/17 A BROWNING    Streptococcus agalactiae NOT DETECTED NOT DETECTED Final   Streptococcus pneumoniae NOT DETECTED NOT DETECTED Final   Streptococcus pyogenes NOT DETECTED NOT DETECTED Final   Acinetobacter baumannii NOT DETECTED NOT DETECTED Final   Enterobacteriaceae species NOT DETECTED NOT DETECTED Final   Enterobacter cloacae complex NOT DETECTED NOT DETECTED Final   Escherichia coli NOT DETECTED NOT DETECTED Final   Klebsiella oxytoca NOT DETECTED  NOT DETECTED Final   Klebsiella pneumoniae NOT DETECTED NOT DETECTED Final   Proteus species NOT DETECTED NOT DETECTED Final   Serratia marcescens NOT DETECTED NOT DETECTED Final   Haemophilus influenzae NOT DETECTED NOT DETECTED Final   Neisseria meningitidis NOT DETECTED NOT DETECTED Final   Pseudomonas aeruginosa NOT DETECTED NOT DETECTED Final   Candida albicans NOT DETECTED NOT DETECTED Final   Candida glabrata NOT DETECTED NOT DETECTED Final   Candida krusei NOT DETECTED NOT DETECTED Final   Candida parapsilosis NOT DETECTED NOT DETECTED Final   Candida tropicalis NOT DETECTED NOT DETECTED Final     Comment: Performed at [facility], [address], [place], [place] [address]  Culture, blood (Routine x 2)     Status: None (Preliminary result)   Collection Time: 04/27/17 11:38 PM  Result Value Ref Range Status   Specimen Description BLOOD LEFT ANTECUBITAL  Final   Special Requests   Final    BOTTLES DRAWN AEROBIC AND ANAEROBIC Blood Culture adequate volume   Culture   Final    NO GROWTH 2 DAYS Performed at [facility], [address], [place], [place] [address]    Report Status PENDING  Incomplete    1 of 2 blood cultures with strep species is hard to interpret and could be a contaminant or a pathogen. Given the patient's "scratchy throat", tachycardia, soft BP, elevated LA - it is possible that this could be a true pathogen.  Call for symptom check - If resolved/improving - no treatment needed - If unresolved and still not feeling well - start Amoxicillin 500 mg po every 8 hours for 10 days. Return to the ED if symptoms worsen or do not improve during the course of treatment. Also return to the ED if symptoms return after treatment is completed.   ED Provider: Harvie Heck, PA-C  Rolley Sims 05/01/2017, 10:17 AM Infectious Diseases Pharmacist Phone# [phone]

## 2017-05-02 NOTE — Telephone Encounter (Signed)
Post ED Visit - Positive Culture Follow-up: Successful Patient Follow-Up  Culture assessed and recommendations reviewed by: []  Enzo BiNathan Batchelder, Pharm.D. []  Celedonio MiyamotoJeremy Frens, Pharm.D., BCPS AQ-ID []  Garvin FilaMike Maccia, Pharm.D., BCPS []  Georgina PillionElizabeth Martin, Pharm.D., BCPS []  AlderpointMinh Pham, [address]D., BCPS, AAHIVP []  Estella HuskMichelle Turner, Pharm.D., BCPS, AAHIVP []  Lysle Pearlachel Rumbarger, PharmD, BCPS []  Casilda Carlsaylor Stone, PharmD, BCPS []  Pollyann SamplesAndy Johnston, PharmD, BCPS  Positive blood culture  []  Patient discharged without antimicrobial prescription and treatment is now indicated []  Organism is resistant to prescribed ED discharge antimicrobial [x]  Patient with positive blood cultures  Changes discussed with ED provider: Harvie HeckSamantha Petrucelli PA New antibiotic prescription + symptoms, start Amoxicillin 500mg  po q 8 hours x 10 days [phone]  Contacted patient and wife  05/02/17 1224   Berle MullMiller, Rahshawn Remo 05/02/2017, 12:22 PM

## 2017-05-03 LAB — CULTURE, BLOOD (ROUTINE X 2)
Culture: NO GROWTH
Special Requests: ADEQUATE

## 2017-12-28 ENCOUNTER — Ambulatory Visit
Admission: RE | Admit: 2017-12-28 | Discharge: 2017-12-28 | Disposition: A | Discharge status: Home / Self Care | Payer: BLUE CROSS/BLUE SHIELD | Source: Ambulatory Visit | Attending: Family Medicine | Admitting: Family Medicine

## 2017-12-28 ENCOUNTER — Other Ambulatory Visit: Payer: Self-pay | Admitting: Family Medicine

## 2017-12-28 DIAGNOSIS — R52 Pain, unspecified: Secondary | ICD-10-CM

## 2018-06-29 DIAGNOSIS — L03031 Cellulitis of right toe: Secondary | ICD-10-CM

## 2018-06-29 DIAGNOSIS — L03032 Cellulitis of left toe: Secondary | ICD-10-CM | POA: Insufficient documentation

## 2018-06-29 HISTORY — DX: Cellulitis of left toe: L03.032

## 2018-06-29 HISTORY — DX: Cellulitis of right toe: L03.031

## 2018-09-08 DIAGNOSIS — I319 Disease of pericardium, unspecified: Secondary | ICD-10-CM

## 2018-09-08 DIAGNOSIS — J159 Unspecified bacterial pneumonia: Secondary | ICD-10-CM

## 2018-09-08 DIAGNOSIS — I308 Other forms of acute pericarditis: Secondary | ICD-10-CM | POA: Diagnosis not present

## 2018-09-08 DIAGNOSIS — I3139 Other pericardial effusion (noninflammatory): Secondary | ICD-10-CM | POA: Insufficient documentation

## 2018-09-08 DIAGNOSIS — D72829 Elevated white blood cell count, unspecified: Secondary | ICD-10-CM

## 2018-09-08 DIAGNOSIS — I313 Pericardial effusion (noninflammatory): Secondary | ICD-10-CM

## 2018-09-08 DIAGNOSIS — E876 Hypokalemia: Secondary | ICD-10-CM

## 2018-09-08 DIAGNOSIS — J309 Allergic rhinitis, unspecified: Secondary | ICD-10-CM

## 2018-09-08 DIAGNOSIS — G4733 Obstructive sleep apnea (adult) (pediatric): Secondary | ICD-10-CM | POA: Diagnosis not present

## 2018-09-08 HISTORY — DX: Other pericardial effusion (noninflammatory): I31.39

## 2018-09-08 HISTORY — DX: Unspecified bacterial pneumonia: J15.9

## 2018-09-08 HISTORY — DX: Disease of pericardium, unspecified: I31.9

## 2018-09-08 HISTORY — DX: Pericardial effusion (noninflammatory): I31.3

## 2018-09-09 DIAGNOSIS — D72829 Elevated white blood cell count, unspecified: Secondary | ICD-10-CM | POA: Diagnosis not present

## 2018-09-09 DIAGNOSIS — J159 Unspecified bacterial pneumonia: Secondary | ICD-10-CM | POA: Diagnosis not present

## 2018-09-09 DIAGNOSIS — E876 Hypokalemia: Secondary | ICD-10-CM | POA: Diagnosis not present

## 2018-09-09 DIAGNOSIS — G4733 Obstructive sleep apnea (adult) (pediatric): Secondary | ICD-10-CM | POA: Diagnosis not present

## 2018-09-09 DIAGNOSIS — I319 Disease of pericardium, unspecified: Secondary | ICD-10-CM | POA: Diagnosis not present

## 2018-09-10 DIAGNOSIS — J159 Unspecified bacterial pneumonia: Secondary | ICD-10-CM | POA: Diagnosis not present

## 2018-09-10 DIAGNOSIS — I319 Disease of pericardium, unspecified: Secondary | ICD-10-CM | POA: Diagnosis not present

## 2018-09-10 DIAGNOSIS — E876 Hypokalemia: Secondary | ICD-10-CM | POA: Diagnosis not present

## 2018-09-10 DIAGNOSIS — D72829 Elevated white blood cell count, unspecified: Secondary | ICD-10-CM | POA: Diagnosis not present

## 2018-09-10 DIAGNOSIS — G4733 Obstructive sleep apnea (adult) (pediatric): Secondary | ICD-10-CM | POA: Diagnosis not present

## 2018-09-28 ENCOUNTER — Ambulatory Visit: Payer: BC Managed Care – PPO | Admitting: Cardiology

## 2018-10-15 NOTE — Progress Notes (Signed)
Cardiology Office Note:    Date:  10/16/2018   ID:  Justin Mayer, DOB 1960/05/05, MRN [id number]  PCP:  Enid Skeens., MD  Cardiologist:  Shirlee More, MD    Referring MD: Lin Landsman, MD    ASSESSMENT:    1. Acute idiopathic pericarditis   2. Essential hypertension   3. Sleep apnea, unspecified type    PLAN:    In order of problems listed above:  1. Pericarditis - Resolved. Denies chest pain. EKG today SR without ST changes previously seen while in hospital. He ran out of Colchicine 4 days ago - will resume Colchicine 0.48m to complete 3 month course. Has been taking ibuprofen intermittently - CRP, sed rate, BMET today - if CRP remains elevated will ask him to resume NSAID.   2. HTN - BP well controlled today. Checks intermittently at home. Reports constant cough and was hypokalemic while hospitalized - will stop Lisinopril/HCTZ. Start Telmisartan 28mdaily. He will keep BP log - BP goal <130/80.  3. OSA - Continue CPAP.  4. Hypokalemia - Noted while hospitalized. Likely secondary to Lisinopril/HCTZ Rx which has been discontinue, as above. Will recheck BMET today.   Next appointment: 2 months   Medication Adjustments/Labs and Tests Ordered: Current medicines are reviewed at length with the patient today.  Concerns regarding medicines are outlined above.  Orders Placed This Encounter  Procedures  . C-reactive protein  . Sed Rate (ESR)  . Basic Metabolic Panel (BMET)  . EKG 12-Lead   Meds ordered this encounter  Medications  . colchicine 0.6 MG tablet    Sig: Take 1 tablet (0.6 mg total) by mouth daily.    Dispense:  60 tablet    Refill:  0  . telmisartan (MICARDIS) 20 MG tablet    Sig: Take 1 tablet (20 mg total) by mouth daily.    Dispense:  90 tablet    Refill:  1    Chief Complaint  Patient presents with  . Follow-up    pericarditis  . Hypertension    History of Present Illness:    StGarrette Mayer a [age].o. male with OSA, HTN,  lumbar degenerative disc disease, allergic rhinitis who presents today for follow up and to establish cardiology care [facility].   He was admitted 09/08/18 for chest pain and SOB. EKG consistent with  pericarditis. Dx with pericarditis, CAP, hypokalemia (3.4 on admission). CRP >40. Troponin negative x4 during admission. Treated with colchicine and NSAIDs. Echo with EF 55-60% Works as a loProgrammer, systems  Reports feeling well since discharge. Has been taking ibuprofen intermittently and finished the 30 day supply of colchicine he was provided at discharge. Tells me he now has been able to sleep laying flat and no longer had SOB when laying down. Rested appropriately on discharge and has been able to increase his activities back to things such as mowing the lawn without CP nor DOE.   Reports he will intermittently get palpitations, not associate with SOB, not associated with chest pain  - these palpitations self resolve and are not bothersome to him. Checks his BP intermittently at home.   Compliance with diet, lifestyle and medications: Yes Past Medical History:  Diagnosis Date  . Allergic rhinitis   . Arthritis   . Bacterial pneumonia 09/08/2018   [facility]  . Hypertension   . Lumbar degenerative disc disease   . Pericardial effusion 09/08/2018   (a.) during  [facility] admission 09/08/18-09/10/18 in the setting of pericarditis  . Pericarditis 09/08/2018   (a.) during [facility] admission 09/08/18-09/10/18  . Sleep apnea     Past Surgical History:  Procedure Laterality Date  . CHOLECYSTECTOMY    . STRABISMUS SURGERY Bilateral 12/17/2016   Procedure: BILATERAL STRABISMUS REPAIR;  Surgeon: Everitt Amber, MD;  Location: [place];  Service: Ophthalmology;  Laterality: Bilateral;    Current Medications: Current Meds  Medication Sig  . Ascorbic Acid (VITAMIN C) 1000 MG tablet Take  1,500 mg by mouth daily.  Marland Kitchen aspirin EC 81 MG tablet Take 81 mg by mouth daily.  Marland Kitchen atenolol (TENORMIN) 50 MG tablet Take 50 mg by mouth daily.  Marland Kitchen GARLIC PO Take 1 tablet by mouth daily.  . Melatonin 5 MG TABS Take 15 mg by mouth at bedtime.  . Omega-3 Fatty Acids (FISH OIL PO) Take 1 tablet by mouth 2 (two) times daily.  Marland Kitchen testosterone cypionate (DEPOTESTOSTERONE CYPIONATE) 200 MG/ML injection INJECT 1 ML EVERY 2 WEEKS  . [DISCONTINUED] lisinopril-hydrochlorothiazide (PRINZIDE,ZESTORETIC) 20-25 MG tablet Take 1 tablet by mouth daily.     Allergies:   Patient has no known allergies.   Social History   Socioeconomic History  . Marital status: Married    Spouse name: Not on file  . Number of children: Not on file  . Years of education: Not on file  . Highest education level: Not on file  Occupational History  . Not on file  Social Needs  . Financial resource strain: Not on file  . Food insecurity    Worry: Not on file    Inability: Not on file  . Transportation needs    Medical: Not on file    Non-medical: Not on file  Tobacco Use  . Smoking status: Never Smoker  . Smokeless tobacco: Never Used  Substance and Sexual Activity  . Alcohol use: No  . Drug use: No  . Sexual activity: Not on file  Lifestyle  . Physical activity    Days per week: Not on file    Minutes per session: Not on file  . Stress: Not on file  Relationships  . Social Herbalist on phone: Not on file    Gets together: Not on file    Attends religious service: Not on file    Active member of club or organization: Not on file    Attends meetings of clubs or organizations: Not on file    Relationship status: Not on file  Other Topics Concern  . Not on file  Social History Narrative  . Not on file     Family History: The patient's family history includes Valvular heart disease in his mother. ROS:   Review of Systems  Constitution: Negative for chills, fever and malaise/fatigue.   Cardiovascular: Positive for palpitations (intermittent). Negative for chest pain, dyspnea on exertion, leg swelling (resolved since hospital discharge) and near-syncope.  Respiratory: Positive for sleep disturbances due to breathing (improved). Negative for cough, shortness of breath and wheezing.   Gastrointestinal: Negative for diarrhea, nausea and vomiting.  Neurological: Negative for dizziness, light-headedness and weakness.    Please see the history of present illness.    All other systems reviewed and are negative.  EKGs/Labs/Other Studies Reviewed:    The following studies were reviewed today:  Echo 08/2018 1. Normal global LV contractility 2. Overall LV systolic function normal with EF 55-60% 3. No regional wall motion abnormalities noted 3. RV normal  in size and function 5. Inferior vena cava mildly dilated  Chest CT 08/2018 1. Pericardial effusion, which could be infectious or inflammatory, with maximum thickness of 41m 2. Mild bilateral lower lobe and lingular peripheral airspace consolidation. This may represent atypical multifocal pneumonia versus atelectasis  EKG:  EKG ordered today and personally reviewed.  The ekg ordered today demonstrates SR without ST/T wave changes.   Recent Labs: No results found for requested labs within last 8760 hours.  Recent Lipid Panel No results found for: CHOL, TRIG, HDL, CHOLHDL, VLDL, LDLCALC, LDLDIRECT  Physical Exam:    VS:  BP 120/84 (BP Location: Right Arm, Patient Position: Sitting, Cuff Size: Large)   Pulse 73   Ht 6' (1.829 m)   Wt 232 lb 6.4 oz (105.4 kg)   SpO2 98%   BMI 31.52 kg/m     Wt Readings from Last 3 Encounters:  10/16/18 232 lb 6.4 oz (105.4 kg)  04/27/17 277 lb (125.6 kg)  12/17/16 265 lb (120.2 kg)     GEN:  Well nourished, well developed in no acute distress HEENT: Normal NECK: No JVD; No carotid bruits LYMPHATICS: No lymphadenopathy CARDIAC: RRR, no murmurs, rubs, gallops RESPIRATORY:  Clear to  auscultation without rales, wheezing or rhonchi  ABDOMEN: Soft, non-tender, non-distended MUSCULOSKELETAL:  No edema; No deformity  SKIN: Warm and dry NEUROLOGIC:  Alert and oriented x 3 PSYCHIATRIC:  Normal affect    Signed, BShirlee More MD  10/16/2018 11:23 AM    CHazel Dell

## 2018-10-16 ENCOUNTER — Other Ambulatory Visit: Payer: Self-pay

## 2018-10-16 ENCOUNTER — Ambulatory Visit (INDEPENDENT_AMBULATORY_CARE_PROVIDER_SITE_OTHER): Payer: BC Managed Care – PPO | Admitting: Cardiology

## 2018-10-16 ENCOUNTER — Encounter: Payer: Self-pay | Admitting: Cardiology

## 2018-10-16 VITALS — BP 120/84 | HR 73 | Ht 72.0 in | Wt 232.4 lb

## 2018-10-16 DIAGNOSIS — E876 Hypokalemia: Secondary | ICD-10-CM | POA: Insufficient documentation

## 2018-10-16 DIAGNOSIS — I10 Essential (primary) hypertension: Secondary | ICD-10-CM

## 2018-10-16 DIAGNOSIS — G4730 Sleep apnea, unspecified: Secondary | ICD-10-CM

## 2018-10-16 DIAGNOSIS — I300 Acute nonspecific idiopathic pericarditis: Secondary | ICD-10-CM

## 2018-10-16 HISTORY — DX: Hypokalemia: E87.6

## 2018-10-16 MED ORDER — COLCHICINE 0.6 MG PO TABS: 0.6000 mg | ORAL_TABLET | Freq: Every day | ORAL | 0 refills | Status: DC

## 2018-10-16 MED ORDER — TELMISARTAN 20 MG PO TABS: 20.0000 mg | ORAL_TABLET | Freq: Every day | ORAL | 1 refills | Status: DC

## 2018-10-16 NOTE — Patient Instructions (Addendum)
Medication Instructions:  Your physician has recommended you make the following change in your medication:   STOP Lisinoprol/HCTZ - we anticipate this is the cause of your cough  START Telmisartan 20mg  (one tablet) daily for high blood pressure  RESTART Colchicine  0.6 mg (one tablet) daily for two months. This is to prevent recurrence of pericarditis.   If you need a refill on your cardiac medications before your next appointment, please call your pharmacy.   Lab work: Your physician recommends that you return for lab work today: sed rate, CRP, BMET  If you have labs (blood work) drawn today and your tests are completely normal, you will receive your results only by: Marland Kitchen MyChart Message (if you have MyChart) OR . A paper copy in the mail If you have any lab test that is abnormal or we need to change your treatment, we will call you to review the results.  Testing/Procedures: You had an EKG today  Follow-Up: At [facility], you and your health needs are our priority.  As part of our continuing mission to provide you with exceptional heart care, we have created designated Provider Care Teams.  These Care Teams include your primary Cardiologist (physician) and Advanced Practice Providers (APPs -  Physician Assistants and Nurse Practitioners) who all work together to provide you with the care you need, when you need it. . You will need a follow up appointment in 2 months.   Any Other Special Instructions Will Be Listed Below (If Applicable).  Please check blood pressure daily and keep a log. Call our office if systolic blood pressure (top number) is consistently >130 or <110.  Telmisartan tablets What is this medicine? TELMISARTAN (tel mi SAR tan) is used to treat high blood pressure. This medicine may be used for other purposes; ask your health care provider or pharmacist if you have questions. COMMON BRAND NAME(S): Micardis What should I tell my health care provider before I take  this medicine? They need to know if you have any of these conditions:  if you are on a special diet, such as a low-salt diet  kidney or liver disease  an unusual or allergic reaction to telmisartan, other medicines, foods, dyes, or preservatives  pregnant or trying to get pregnant  breast-feeding How should I use this medicine? Take this medicine by mouth with a glass of water. Follow the directions on the prescription label. This medicine can be taken with or without food. Take your doses at regular intervals. Do not take your medicine more often than directed. Talk to your pediatrician regarding the use of this medicine in children. Special care may be needed. Overdosage: If you think you have taken too much of this medicine contact a poison control center or emergency room at once. NOTE: This medicine is only for you. Do not share this medicine with others. What if I miss a dose? If you miss a dose, take it as soon as you can. If it is almost time for your next dose, take only that dose. Do not take double or extra doses. What may interact with this medicine?  digoxin  potassium salts or potassium supplements  warfarin This list may not describe all possible interactions. Give your health care provider a list of all the medicines, herbs, non-prescription drugs, or dietary supplements you use. Also tell them if you smoke, drink alcohol, or use illegal drugs. Some items may interact with your medicine. What should I watch for while using this medicine? Visit  your doctor or health care professional for regular checks on your progress. Check your blood pressure as directed. Ask your doctor or health care professional what your blood pressure should be and when you should contact him or her. Call your doctor or health care professional if you notice an irregular or fast heart beat. Women should inform their doctor if they wish to become pregnant or think they might be pregnant. There is a  potential for serious side effects to an unborn child, particularly in the second or third trimester. Talk to your health care professional or pharmacist for more information. You may get drowsy or dizzy. Do not drive, use machinery, or do anything that needs mental alertness until you know how this drug affects you. Do not stand or sit up quickly, especially if you are an older patient. This reduces the risk of dizzy or fainting spells. Alcohol can make you more drowsy and dizzy. Avoid alcoholic drinks. Avoid salt substitutes unless you are told otherwise by your doctor or health care professional. Do not treat yourself for coughs, colds, or pain while you are taking this medicine without asking your doctor or health care professional for advice. Some ingredients may increase your blood pressure. What side effects may I notice from receiving this medicine? Side effects that you should report to your doctor or health care professional as soon as possible:  allergic reactions like skin rash, itching or hives, swelling of the face, lips, or tongue  breathing problems  dark urine  gout pain  muscle pains  slow heartbeat  trouble passing urine or change in the amount of urine  unusual bleeding or bruising  yellowing of the eyes or skin Side effects that usually do not require medical attention (report to your doctor or health care professional if they continue or are bothersome):  back pain  change in sex drive or performance  diarrhea  sore throat or stuffy nose This list may not describe all possible side effects. Call your doctor for medical advice about side effects. You may report side effects to FDA at 1-800-FDA-1088. Where should I keep my medicine? Keep out of the reach of children. Store at room temperature between 15 and 30 degrees C (59 and 86 degrees F). Tablets should not be removed from the blisters until right before use. Throw away any unused medicine after the  expiration date. NOTE: This sheet is a summary. It may not cover all possible information. If you have questions about this medicine, talk to your doctor, pharmacist, or health care provider.  2020 Elsevier/Gold Standard (2007-05-03 13:39:10)

## 2018-10-17 ENCOUNTER — Telehealth: Payer: Self-pay

## 2018-10-17 DIAGNOSIS — I300 Acute nonspecific idiopathic pericarditis: Secondary | ICD-10-CM

## 2018-10-17 LAB — BASIC METABOLIC PANEL
BUN/Creatinine Ratio: 13 (ref 9–20)
BUN: 13 mg/dL (ref 6–24)
CO2: 23 mmol/L (ref 20–29)
Calcium: 8.9 mg/dL (ref 8.7–10.2)
Chloride: 101 mmol/L (ref 96–106)
Creatinine, Ser: 1.04 mg/dL (ref 0.76–1.27)
GFR calc Af Amer: 92 mL/min/{1.73_m2} (ref >59)
GFR calc non Af Amer: 80 mL/min/{1.73_m2} (ref >59)
Glucose: 104 mg/dL — ABNORMAL HIGH (ref 65–99)
Potassium: 3.6 mmol/L (ref 3.5–5.2)
Sodium: 139 mmol/L (ref 134–144)

## 2018-10-17 LAB — SEDIMENTATION RATE: Sed Rate: 43 mm/hr — ABNORMAL HIGH (ref 0–30)

## 2018-10-17 LAB — C-REACTIVE PROTEIN: CRP: 14 mg/L — ABNORMAL HIGH (ref 0–10)

## 2018-10-17 MED ORDER — IBUPROFEN 200 MG PO TABS: 400.0000 mg | ORAL_TABLET | Freq: Three times a day (TID) | ORAL | 0 refills | Status: DC

## 2018-10-17 NOTE — Telephone Encounter (Signed)
Patient returned call informed to take ibuprofen 400 mg three times daily.Then repeat CRP and sed rate in two weeks. Patient verbalized understanding.

## 2018-10-17 NOTE — Telephone Encounter (Signed)
Left message for patient to return call for results. Will continue efforts. 

## 2018-10-17 NOTE — Telephone Encounter (Signed)
-----   Message from Richardo Priest, MD sent at 10/17/2018  7:44 AM EDT ----- Normal or stable result  His inflammatory markers are elevated and like him to go on ibuprofen 400 mg 3 times daily and in 2 weeks to repeat CRP and sedimentation rate

## 2018-10-20 ENCOUNTER — Telehealth: Payer: Self-pay | Admitting: Cardiology

## 2018-10-20 NOTE — Telephone Encounter (Signed)
Returned your call.

## 2018-10-20 NOTE — Telephone Encounter (Signed)
Spoke with patient and he informed me that Justin Mayer informed him to take ibuprofen 400 mg three times daily.Then repeat CRP and sed rate in two weeks. Patient verbalized understanding. No further questions.

## 2018-11-07 ENCOUNTER — Other Ambulatory Visit: Payer: Self-pay | Admitting: Cardiology

## 2018-11-07 DIAGNOSIS — I300 Acute nonspecific idiopathic pericarditis: Secondary | ICD-10-CM

## 2018-12-21 ENCOUNTER — Other Ambulatory Visit: Payer: Self-pay | Admitting: *Deleted

## 2018-12-21 DIAGNOSIS — I300 Acute nonspecific idiopathic pericarditis: Secondary | ICD-10-CM

## 2018-12-21 MED ORDER — COLCHICINE 0.6 MG PO TABS: 0.6000 mg | ORAL_TABLET | Freq: Every day | ORAL | 0 refills | Status: DC

## 2018-12-29 ENCOUNTER — Ambulatory Visit: Payer: BC Managed Care – PPO | Admitting: Cardiology

## 2019-01-08 ENCOUNTER — Other Ambulatory Visit: Payer: Self-pay

## 2019-01-08 ENCOUNTER — Ambulatory Visit (INDEPENDENT_AMBULATORY_CARE_PROVIDER_SITE_OTHER): Payer: BC Managed Care – PPO | Admitting: Cardiology

## 2019-01-08 VITALS — BP 132/88 | HR 82 | Ht 72.0 in | Wt 243.0 lb

## 2019-01-08 DIAGNOSIS — I319 Disease of pericardium, unspecified: Secondary | ICD-10-CM | POA: Diagnosis not present

## 2019-01-08 DIAGNOSIS — I10 Essential (primary) hypertension: Secondary | ICD-10-CM

## 2019-01-08 DIAGNOSIS — E876 Hypokalemia: Secondary | ICD-10-CM

## 2019-01-08 NOTE — Progress Notes (Signed)
Cardiology Office Note:    Date:  01/08/2019   ID:  Justin Mayer, DOB [birth year], MRN [id number]  PCP:  Enid Skeens., MD  Cardiologist:  Shirlee More, MD    Referring MD: Enid Skeens., MD    ASSESSMENT:    1. Pericarditis, unspecified chronicity, unspecified type   2. Hypertension, unspecified type   3. Hypokalemia    PLAN:    In order of problems listed above:  1. Pericarditis resolved check inflammatory markers if normal he will stop his colchicine. 2. Well-controlled continue ARB 3. Recheck renal function presently not on supplements   Next appointment: As needed   Medication Adjustments/Labs and Tests Ordered: Current medicines are reviewed at length with the patient today.  Concerns regarding medicines are outlined above.  Orders Placed This Encounter  Procedures  . Sedimentation rate  . C-reactive protein  . Basic Metabolic Panel (BMET)   No orders of the defined types were placed in this encounter.   Chief Complaint  Patient presents with  . Follow-up    For pericarditis    History of Present Illness:    Justin Mayer is a 58 y.o. male with a hx of pericarditis 09/08/2018 admitted to [facility] last seen 10/16/2018. Compliance with diet, lifestyle and medications: Yes  He has been on colchicine more than 3 months and has had no recurrence of fever respiratory symptoms or pleuritic pericardial pain.  Initially is quite sick with profound EKG changes pericardial rub and is no longer on nonsteroidal anti-inflammatory drugs.  I think we need to repeat an EKG today.  We will check his inflammatory markers and if they are normal he has stopped pericarditis after this prescription.  His hypertension is well controlled he has had previous hypokalemia recheck his renal function but is not on a diuretic at this time.  He was admitted 09/08/18 for chest pain and SOB. EKG consistent with  pericarditis. Dx with pericarditis, CAP, hypokalemia (3.4 on  admission). CRP >40. Troponin negative x4 during admission. Treated with colchicine and NSAIDs. Echo with EF 55-60%    Ref Range & Units 73mo ago  CRP 0 - 10 mg/L 14High     Ref Range & Units 38mo ago   Sed Rate 0 - 30 mm/hr 43High      Past Medical History:  Diagnosis Date  . Allergic rhinitis   . Arthritis   . Bacterial pneumonia 09/08/2018   (a.) during [facility] hospitalization  . Hypertension   . Lumbar degenerative disc disease   . Pericardial effusion 09/08/2018   (a.) during [facility] admission 09/08/18-09/10/18 in the setting of pericarditis  . Pericarditis 09/08/2018   (a.) during [facility] admission 09/08/18-09/10/18  . Sleep apnea     Past Surgical History:  Procedure Laterality Date  . CHOLECYSTECTOMY    . STRABISMUS SURGERY Bilateral 12/17/2016   Procedure: BILATERAL STRABISMUS REPAIR;  Surgeon: Everitt Amber, MD;  Location: [place];  Service: Ophthalmology;  Laterality: Bilateral;    Current Medications: Current Meds  Medication Sig  . Ascorbic Acid (VITAMIN C) 1000 MG tablet Take 1,500 mg by mouth daily.  Marland Kitchen aspirin EC 81 MG tablet Take 81 mg by mouth daily.  Marland Kitchen atenolol (TENORMIN) 50 MG tablet Take 50 mg by mouth daily.  . colchicine 0.6 MG tablet Take 1 tablet (0.6 mg total) by mouth daily.  Marland Kitchen GARLIC PO Take 1 tablet by mouth daily.  . Melatonin 5 MG TABS Take 10 mg by mouth  at bedtime.   . Multiple Vitamins-Minerals (ZINC PO) Take 1 tablet by mouth daily.  . Omega-3 Fatty Acids (FISH OIL PO) Take 1 tablet by mouth 2 (two) times daily.  Marland Kitchen telmisartan (MICARDIS) 20 MG tablet Take 1 tablet (20 mg total) by mouth daily.  Marland Kitchen testosterone cypionate (DEPOTESTOSTERONE CYPIONATE) 200 MG/ML injection INJECT 1 ML EVERY 2 WEEKS     Allergies:   Patient has no known allergies.   Social History   Socioeconomic History  . Marital status: Married    Spouse name: Not on file  . Number of children: Not on file  . Years of education:  Not on file  . Highest education level: Not on file  Occupational History  . Not on file  Social Needs  . Financial resource strain: Not on file  . Food insecurity    Worry: Not on file    Inability: Not on file  . Transportation needs    Medical: Not on file    Non-medical: Not on file  Tobacco Use  . Smoking status: Never Smoker  . Smokeless tobacco: Never Used  Substance and Sexual Activity  . Alcohol use: No  . Drug use: No  . Sexual activity: Not on file  Lifestyle  . Physical activity    Days per week: Not on file    Minutes per session: Not on file  . Stress: Not on file  Relationships  . Social Musician on phone: Not on file    Gets together: Not on file    Attends religious service: Not on file    Active member of club or organization: Not on file    Attends meetings of clubs or organizations: Not on file    Relationship status: Not on file  Other Topics Concern  . Not on file  Social History Narrative  . Not on file     Family History: The patient's family history includes Valvular heart disease in his mother. ROS:   Please see the history of present illness.    All other systems reviewed and are negative.  EKGs/Labs/Other Studies Reviewed:    The following studies were reviewed today:   Physical Exam:    VS:  BP 132/88 (BP Location: Left Arm, Patient Position: Sitting, Cuff Size: Large)   Pulse 82   Ht 6' (1.829 m)   Wt 243 lb (110.2 kg)   SpO2 98%   BMI 32.96 kg/m     Wt Readings from Last 3 Encounters:  01/08/19 243 lb (110.2 kg)  10/16/18 232 lb 6.4 oz (105.4 kg)  04/27/17 277 lb (125.6 kg)     GEN:  Well nourished, well developed in no acute distress HEENT: Normal NECK: No JVD; No carotid bruits LYMPHATICS: No lymphadenopathy CARDIAC: \RRR, no murmurs, rubs, gallops RESPIRATORY:  Clear to auscultation without rales, wheezing or rhonchi  ABDOMEN: Soft, non-tender, non-distended MUSCULOSKELETAL:  No edema; No deformity   SKIN: Warm and dry NEUROLOGIC:  Alert and oriented x 3 PSYCHIATRIC:  Normal affect    Signed, Norman Herrlich, MD  01/08/2019 2:37 PM    [facility]

## 2019-01-08 NOTE — Patient Instructions (Signed)
Medication Instructions:  Your physician recommends that you continue on your current medications as directed. Please refer to the Current Medication list given to you today.  *If you need a refill on your cardiac medications before your next appointment, please call your pharmacy*  Lab Work: Your physician recommends that you return for lab work today: sedimentation rate, c-reactive protein, BMP.   If you have labs (blood work) drawn today and your tests are completely normal, you will receive your results only by: Marland Kitchen MyChart Message (if you have MyChart) OR . A paper copy in the mail If you have any lab test that is abnormal or we need to change your treatment, we will call you to review the results.  Testing/Procedures: None  Follow-Up: At [facility], you and your health needs are our priority.  As part of our continuing mission to provide you with exceptional heart care, we have created designated Provider Care Teams.  These Care Teams include your primary Cardiologist (physician) and Advanced Practice Providers (APPs -  Physician Assistants and Nurse Practitioners) who all work together to provide you with the care you need, when you need it.  Your next appointment:   as needed if symptoms worsen or fail to improve  The format for your next appointment:   In Person  Provider:   Shirlee More, MD

## 2019-01-09 ENCOUNTER — Telehealth: Payer: Self-pay

## 2019-01-09 LAB — BASIC METABOLIC PANEL
BUN/Creatinine Ratio: 14 (ref 9–20)
BUN: 14 mg/dL (ref 6–24)
CO2: 23 mmol/L (ref 20–29)
Calcium: 9.4 mg/dL (ref 8.7–10.2)
Chloride: 107 mmol/L — ABNORMAL HIGH (ref 96–106)
Creatinine, Ser: 1.02 mg/dL (ref 0.76–1.27)
GFR calc Af Amer: 94 mL/min/{1.73_m2} (ref >59)
GFR calc non Af Amer: 81 mL/min/{1.73_m2} (ref >59)
Glucose: 90 mg/dL (ref 65–99)
Potassium: 4.1 mmol/L (ref 3.5–5.2)
Sodium: 142 mmol/L (ref 134–144)

## 2019-01-09 LAB — SEDIMENTATION RATE: Sed Rate: 19 mm/hr (ref 0–30)

## 2019-01-09 LAB — C-REACTIVE PROTEIN: CRP: <1 mg/L (ref 0–10)

## 2019-01-09 NOTE — Telephone Encounter (Signed)
Left message on voicemail with normal lab results and that patient could discontinue colchicine once prescription runs out.  Advised patient to return call to office with any questions or concerns.

## 2019-01-16 ENCOUNTER — Other Ambulatory Visit: Payer: Self-pay | Admitting: Cardiology

## 2019-01-16 DIAGNOSIS — I300 Acute nonspecific idiopathic pericarditis: Secondary | ICD-10-CM

## 2019-04-07 ENCOUNTER — Other Ambulatory Visit: Payer: Self-pay | Admitting: Cardiology

## 2019-04-07 DIAGNOSIS — I10 Essential (primary) hypertension: Secondary | ICD-10-CM

## 2019-09-17 ENCOUNTER — Other Ambulatory Visit: Payer: Self-pay | Admitting: Cardiology

## 2019-09-17 DIAGNOSIS — I10 Essential (primary) hypertension: Secondary | ICD-10-CM

## 2019-12-19 DIAGNOSIS — M1990 Unspecified osteoarthritis, unspecified site: Secondary | ICD-10-CM | POA: Insufficient documentation

## 2019-12-19 DIAGNOSIS — J309 Allergic rhinitis, unspecified: Secondary | ICD-10-CM | POA: Insufficient documentation

## 2019-12-19 DIAGNOSIS — M5136 Other intervertebral disc degeneration, lumbar region: Secondary | ICD-10-CM | POA: Insufficient documentation

## 2019-12-20 ENCOUNTER — Other Ambulatory Visit: Payer: Self-pay

## 2019-12-20 ENCOUNTER — Ambulatory Visit: Payer: BC Managed Care – PPO | Admitting: Cardiology

## 2019-12-20 ENCOUNTER — Encounter: Payer: Self-pay | Admitting: Cardiology

## 2019-12-20 VITALS — BP 133/91 | HR 77 | Ht 72.0 in | Wt 260.0 lb

## 2019-12-20 DIAGNOSIS — I10 Essential (primary) hypertension: Secondary | ICD-10-CM

## 2019-12-20 DIAGNOSIS — R079 Chest pain, unspecified: Secondary | ICD-10-CM | POA: Diagnosis not present

## 2019-12-20 MED ORDER — METOPROLOL TARTRATE 100 MG PO TABS: 100.0000 mg | ORAL_TABLET | Freq: Once | ORAL | 0 refills | Status: DC

## 2019-12-20 NOTE — Patient Instructions (Addendum)
Medication Instructions:  Your physician recommends that you continue on your current medications as directed. Please refer to the Current Medication list given to you today.  *If you need a refill on your cardiac medications before your next appointment, please call your pharmacy*   Lab Work: Your physician recommends that you return for lab work in: TODAY CRP, CBC, Sed rate, Lipids If you have labs (blood work) drawn today and your tests are completely normal, you will receive your results only by: Marland Kitchen MyChart Message (if you have MyChart) OR . A paper copy in the mail If you have any lab test that is abnormal or we need to change your treatment, we will call you to review the results.   Testing/Procedures: Your physician has requested that you have an echocardiogram. Echocardiography is a painless test that uses sound waves to create images of your heart. It provides your doctor with information about the size and shape of your heart and how well your heart's chambers and valves are working. This procedure takes approximately one hour. There are no restrictions for this procedure.  Your cardiac CT will be scheduled at the below location:   [facility] [address] [place], [place] [address] [phone]   If scheduled at [facility], please arrive at the [facility] main entrance of [facility] 30 minutes prior to test start time. Proceed to the [facility] Radiology Department (first floor) to check-in and test prep.   Please follow these instructions carefully (unless otherwise directed):   On the Night Before the Test: . Be sure to Drink plenty of water. . Do not consume any caffeinated/decaffeinated beverages or chocolate 12 hours prior to your test. . Do not take any antihistamines 12 hours prior to your test.  On the Day of the Test: . Drink plenty of water. Do not drink any water within one hour of the test. . Do not eat any food 4 hours  prior to the test. . You may take your regular medications prior to the test.  . Take metoprolol (Lopressor) two hours prior to test.      After the Test: . Drink plenty of water. . After receiving IV contrast, you may experience a mild flushed feeling. This is normal. . On occasion, you may experience a mild rash up to 24 hours after the test. This is not dangerous. If this occurs, you can take Benadryl 25 mg and increase your fluid intake. . If you experience trouble breathing, this can be serious. If it is severe call 911 IMMEDIATELY. If it is mild, please call our office. . If you take any of these medications: Glipizide/Metformin, Avandament, Glucavance, please do not take 48 hours after completing test unless otherwise instructed.   Once we have confirmed authorization from your insurance company, we will call you to set up a date and time for your test. Based on how quickly your insurance processes prior authorizations requests, please allow up to 4 weeks to be contacted for scheduling your Cardiac CT appointment. Be advised that routine Cardiac CT appointments could be scheduled as many as 8 weeks after your provider has ordered it.  For non-scheduling related questions, please contact the cardiac imaging nurse navigator should you have any questions/concerns: Marchia Bond, Cardiac Imaging Nurse Navigator Burley Saver, Interim Cardiac Imaging Nurse [place] [facility] Direct Office Dial: [phone]   For scheduling needs, including cancellations and rescheduling, please call Vivien Rota at [phone], option  3.     Follow-Up: At [facility] you and your health needs are our priority.  As part of our continuing mission to provide you with exceptional heart care, we have created designated Provider Care Teams.  These Care Teams include your primary Cardiologist (physician) and Advanced Practice Providers (APPs -  Physician Assistants and Nurse Practitioners)  who all work together to provide you with the care you need, when you need it.  We recommend signing up for the patient portal called "MyChart".  Sign up information is provided on this After Visit Summary.  MyChart is used to connect with patients for Virtual Visits (Telemedicine).  Patients are able to view lab/test results, encounter notes, upcoming appointments, etc.  Non-urgent messages can be sent to your provider as well.   To learn more about what you can do with MyChart, go to [email or web address].    Your next appointment:   6 week(s)  The format for your next appointment:   In Person  Provider:   Shirlee More, MD   Other Instructions

## 2019-12-20 NOTE — Progress Notes (Signed)
Cardiology Office Note:    Date:  12/20/2019   ID:  Justin Mayer, DOB 29-Aug-1960, MRN [id number]  PCP:  Justin Done., MD  Cardiologist:  Justin Herrlich, MD    Referring MD: Justin Done., MD    ASSESSMENT:    1. Chest pain of uncertain etiology   2. Hypertension, unspecified type    PLAN:    In order of problems listed above:  1. Differential diagnosis includes recurrent or relapsing pericarditis versus CAD.  For evaluation echocardiogram C-reactive protein sedimentation rate and after discussion of techniques undergo cardiac CTA will also check his renal function today.  He is taking low-dose aspirin. 2. Stable continue current treatment ARB   Next appointment: 4 to 6 weeks   Medication Adjustments/Labs and Tests Ordered: Current medicines are reviewed at length with the patient today.  Concerns regarding medicines are outlined above.  Orders Placed This Encounter  Procedures   CT CORONARY MORPH W/CTA COR W/SCORE W/CA W/CM &/OR WO/CM   CT CORONARY FRACTIONAL FLOW RESERVE DATA PREP   CT CORONARY FRACTIONAL FLOW RESERVE FLUID ANALYSIS   C-reactive protein   CBC   Lipid panel   Sedimentation rate   Basic metabolic panel   EKG 12-Lead   ECHOCARDIOGRAM COMPLETE   No orders of the defined types were placed in this encounter.   Chief Complaint  Patient presents with   Follow-up   Chest Pain    History of Present Illness:    Justin Mayer is a 59 y.o. male with a hx of pericarditis [facility] treated with colchicine and nonsteroidal anti-inflammatory drug last seen by me 01/08/2019.  At that time his pericarditis had resolved colchicine was discontinued.Marland Kitchen His C-reactive protein and sedimentation rate were low CRP less than 1 sed rate 19 and we withdrew colchicine at his last visit. Compliance with diet, lifestyle and medications: Yes  An episode in church last Sunday he felt badly his heart rate is 122 bpm  and he had a subtle discomfort in the left chest he was quite frightened he prayed and in about 10 minutes it resolved.  Its not recurred.  It was not sharp or pleuritic in nature no fever or chills.  He has had no other rapid heart rhythm.  He has been taken over-the-counter supplement bodybuilding and he says that it can make his heart race.  He has not taken it since the episode and of asked him not to restart.  He does not have typical exertional angina no shortness of breath palpitation otherwise edema or syncope.  He has had no preceding respiratory infection Past Medical History:  Diagnosis Date   Allergic rhinitis    Arthritis    Bacterial pneumonia 09/08/2018   (a.) during [facility] hospitalization   Hypertension    Hypokalemia 10/16/2018   Lumbar degenerative disc disease    Paronychia of toe, left 06/29/2018   Paronychia, toe, right 06/29/2018   Pericardial effusion 09/08/2018   (a.) during [facility] admission 09/08/18-09/10/18 in the setting of pericarditis   Pericarditis 09/08/2018   (a.) during [facility] admission 09/08/18-09/10/18   Sleep apnea     Past Surgical History:  Procedure Laterality Date   CHOLECYSTECTOMY     STRABISMUS SURGERY Bilateral 12/17/2016   Procedure: BILATERAL STRABISMUS REPAIR;  Surgeon: Verne Carrow, MD;  Location: [facility];  Service: Ophthalmology;  Laterality: Bilateral;    Current Medications: Current Meds  Medication Sig   Ascorbic Acid (VITAMIN  C) 1000 MG tablet Take 1,500 mg by mouth daily.   aspirin EC 81 MG tablet Take 81 mg by mouth daily.   atenolol (TENORMIN) 50 MG tablet Take 50 mg by mouth daily.   Cholecalciferol (VITAMIN D3) 10 MCG (400 UNIT) CHEW Chew 1 tablet by mouth daily.   GARLIC PO Take 1 tablet by mouth daily.   Multiple Vitamins-Minerals (ZINC PO) Take 1 tablet by mouth daily.   Omega-3 Fatty Acids (FISH OIL PO) Take 1 tablet by mouth 2 (two) times daily.   telmisartan  (MICARDIS) 20 MG tablet TAKE 1 TABLET BY MOUTH EVERY DAY   testosterone cypionate (DEPOTESTOSTERONE CYPIONATE) 200 MG/ML injection INJECT 1 ML EVERY 2 WEEKS   UNABLE TO FIND [employer] War Pre-Workout powder mix daily with water   Zinc 50 MG TABS Take 1 tablet by mouth daily.     Allergies:   Patient has no known allergies.   Social History   Socioeconomic History   Marital status: Married    Spouse name: Not on file   Number of children: Not on file   Years of education: Not on file   Highest education level: Not on file  Occupational History   Not on file  Tobacco Use   Smoking status: Never Smoker   Smokeless tobacco: Never Used  Vaping Use   Vaping Use: Never used  Substance and Sexual Activity   Alcohol use: No   Drug use: No   Sexual activity: Not on file  Other Topics Concern   Not on file  Social History Narrative   Not on file   Social Determinants of Health   Financial Resource Strain:    Difficulty of Paying Living Expenses: Not on file  Food Insecurity:    Worried About Running Out of Food in the Last Year: Not on file   Ran Out of Food in the Last Year: Not on file  Transportation Needs:    Lack of Transportation (Medical): Not on file   Lack of Transportation (Non-Medical): Not on file  Physical Activity:    Days of Exercise per Week: Not on file   Minutes of Exercise per Session: Not on file  Stress:    Feeling of Stress : Not on file  Social Connections:    Frequency of Communication with Friends and Family: Not on file   Frequency of Social Gatherings with Friends and Family: Not on file   Attends Religious Services: Not on file   Active Member of Clubs or Organizations: Not on file   Attends Banker Meetings: Not on file   Marital Status: Not on file     Family History: The patient's family history includes Valvular heart disease in his mother. ROS:   Please see the history of present illness.    All  other systems reviewed and are negative.  EKGs/Labs/Other Studies Reviewed:    The following studies were reviewed today:  EKG:  EKG ordered today and personally reviewed.  The ekg ordered today demonstrates sinus rhythm normal EKG  Recent Labs: 01/08/2019: BUN 14; Creatinine, Ser 1.02; Potassium 4.1; Sodium 142  Recent Lipid Panel No results found for: CHOL, TRIG, HDL, CHOLHDL, VLDL, LDLCALC, LDLDIRECT  Physical Exam:    VS:  BP (!) 133/91    Pulse 77    Ht 6' (1.829 m)    Wt 260 lb (117.9 kg)    SpO2 97%    BMI 35.26 kg/m     Wt Readings from Last  3 Encounters:  12/20/19 260 lb (117.9 kg)  01/08/19 243 lb (110.2 kg)  10/16/18 232 lb 6.4 oz (105.4 kg)     GEN: He is a bit apprehensive well nourished, well developed in no acute distress HEENT: Normal NECK: No JVD; No carotid bruits LYMPHATICS: No lymphadenopathy CARDIAC: He has no rub RRR, no murmurs, rubs, gallops RESPIRATORY:  Clear to auscultation without rales, wheezing or rhonchi  ABDOMEN: Soft, non-tender, non-distended MUSCULOSKELETAL:  No edema; No deformity  SKIN: Warm and dry NEUROLOGIC:  Alert and oriented x 3 PSYCHIATRIC:  Normal affect    Signed, Justin Herrlich, MD  12/20/2019 4:23 PM    [facility]

## 2019-12-21 ENCOUNTER — Telehealth: Payer: Self-pay

## 2019-12-21 LAB — LIPID PANEL
Chol/HDL Ratio: 3.2 ratio (ref 0.0–5.0)
Cholesterol, Total: 148 mg/dL (ref 100–199)
HDL: 46 mg/dL (ref >39)
LDL Chol Calc (NIH): 87 mg/dL (ref 0–99)
Triglycerides: 74 mg/dL (ref 0–149)
VLDL Cholesterol Cal: 15 mg/dL (ref 5–40)

## 2019-12-21 LAB — BASIC METABOLIC PANEL
BUN/Creatinine Ratio: 13 (ref 9–20)
BUN: 14 mg/dL (ref 6–24)
CO2: 26 mmol/L (ref 20–29)
Calcium: 9.6 mg/dL (ref 8.7–10.2)
Chloride: 104 mmol/L (ref 96–106)
Creatinine, Ser: 1.12 mg/dL (ref 0.76–1.27)
GFR calc Af Amer: 84 mL/min/{1.73_m2} (ref >59)
GFR calc non Af Amer: 73 mL/min/{1.73_m2} (ref >59)
Glucose: 98 mg/dL (ref 65–99)
Potassium: 4.1 mmol/L (ref 3.5–5.2)
Sodium: 141 mmol/L (ref 134–144)

## 2019-12-21 LAB — CBC
Hematocrit: 44.7 % (ref 37.5–51.0)
Hemoglobin: 15.3 g/dL (ref 13.0–17.7)
MCH: 30.4 pg (ref 26.6–33.0)
MCHC: 34.2 g/dL (ref 31.5–35.7)
MCV: 89 fL (ref 79–97)
Platelets: 217 10*3/uL (ref 150–450)
RBC: 5.04 x10E6/uL (ref 4.14–5.80)
RDW: 12 % (ref 11.6–15.4)
WBC: 6.2 10*3/uL (ref 3.4–10.8)

## 2019-12-21 LAB — C-REACTIVE PROTEIN: CRP: 1 mg/L (ref 0–10)

## 2019-12-21 LAB — SEDIMENTATION RATE: Sed Rate: 4 mm/hr (ref 0–30)

## 2019-12-21 NOTE — Telephone Encounter (Signed)
-----   Message from Baldo Daub, MD sent at 12/21/2019  8:03 AM EDT ----- Good result for cardiac CTA normal renal function  Lipids are good  CBC and inflammatory markers are normal no findings of pericarditis

## 2019-12-21 NOTE — Telephone Encounter (Signed)
Spoke with patient regarding results and recommendation.  Patient verbalizes understanding and is agreeable to plan of care. Advised patient to call back with any issues or concerns.  

## 2020-01-07 ENCOUNTER — Other Ambulatory Visit: Payer: Self-pay | Admitting: Cardiology

## 2020-01-07 DIAGNOSIS — I10 Essential (primary) hypertension: Secondary | ICD-10-CM

## 2020-01-31 ENCOUNTER — Telehealth (HOSPITAL_COMMUNITY): Payer: Self-pay | Admitting: Emergency Medicine

## 2020-01-31 NOTE — Telephone Encounter (Signed)
Reaching out to patient to offer assistance regarding upcoming cardiac imaging study; pt verbalizes understanding of appt date/time, parking situation and where to check in, pre-test NPO status and medications ordered, and verified current allergies; name and call back number provided for further questions should they arise [name] RN Navigator Cardiac Imaging [name] [phone] office [phone] cell   Taking 100mg  metoprolol 2 hr prior to scan. Holding atenolol and telmisartan day of scan Justin Mayer

## 2020-02-04 ENCOUNTER — Ambulatory Visit (HOSPITAL_COMMUNITY)
Admission: RE | Admit: 2020-02-04 | Discharge: 2020-02-04 | Disposition: A | Discharge status: Home / Self Care | Payer: BC Managed Care – PPO | Source: Ambulatory Visit | Attending: Cardiology | Admitting: Cardiology

## 2020-02-04 ENCOUNTER — Other Ambulatory Visit: Payer: Self-pay

## 2020-02-04 DIAGNOSIS — I10 Essential (primary) hypertension: Secondary | ICD-10-CM | POA: Insufficient documentation

## 2020-02-04 DIAGNOSIS — R079 Chest pain, unspecified: Secondary | ICD-10-CM | POA: Insufficient documentation

## 2020-02-04 MED ORDER — METOPROLOL TARTRATE 5 MG/5ML IV SOLN: 5.0000 mg | INTRAVENOUS | Status: DC | PRN

## 2020-02-04 MED ORDER — NITROGLYCERIN 0.4 MG SL SUBL
SUBLINGUAL_TABLET | SUBLINGUAL | Status: AC
  Administered 2020-02-04: 0.8 mg via SUBLINGUAL
  Filled 2020-02-04: qty 2

## 2020-02-04 MED ORDER — METOPROLOL TARTRATE 5 MG/5ML IV SOLN
INTRAVENOUS | Status: AC
  Administered 2020-02-04: 5 mg via INTRAVENOUS
  Filled 2020-02-04: qty 5

## 2020-02-04 MED ORDER — METOPROLOL TARTRATE 5 MG/5ML IV SOLN
INTRAVENOUS | Status: AC
  Administered 2020-02-04: 5 mg via INTRAVENOUS
  Filled 2020-02-04: qty 10

## 2020-02-04 MED ORDER — NITROGLYCERIN 0.4 MG SL SUBL: 0.8000 mg | SUBLINGUAL_TABLET | Freq: Once | SUBLINGUAL | Status: AC

## 2020-02-04 MED ORDER — IOHEXOL 350 MG/ML SOLN
80.0000 mL | Freq: Once | INTRAVENOUS | Status: AC | PRN
  Administered 2020-02-04: 80 mL via INTRAVENOUS

## 2020-02-04 MED ORDER — DILTIAZEM HCL 25 MG/5ML IV SOLN
INTRAVENOUS | Status: AC
  Administered 2020-02-04: 5 mg via INTRAVENOUS
  Filled 2020-02-04: qty 5

## 2020-02-04 MED ORDER — METOPROLOL TARTRATE 5 MG/5ML IV SOLN
5.0000 mg | Freq: Once | INTRAVENOUS | Status: AC
  Administered 2020-02-04: 5 mg via INTRAVENOUS

## 2020-02-05 ENCOUNTER — Other Ambulatory Visit: Payer: Self-pay

## 2020-02-05 ENCOUNTER — Telehealth: Payer: Self-pay

## 2020-02-05 NOTE — Telephone Encounter (Signed)
Patient called back returning Morgan's call 

## 2020-02-05 NOTE — Telephone Encounter (Signed)
Left message on patients voicemail to please return our call.   

## 2020-02-05 NOTE — Telephone Encounter (Signed)
-----   Message from Baldo Daub, MD sent at 02/05/2020  7:40 AM EST ----- Very good result calcium score is 0 normal coronary arteries and CT of the chest is normal except for one very small pulmonary nodule and in a non-smoker he does not need a follow-up CT

## 2020-02-05 NOTE — Telephone Encounter (Signed)
Spoke with patient regarding results and recommendation.  Patient verbalizes understanding and is agreeable to plan of care. Advised patient to call back with any issues or concerns.  

## 2020-02-07 ENCOUNTER — Ambulatory Visit: Payer: BC Managed Care – PPO | Admitting: Cardiology

## 2020-02-07 NOTE — Progress Notes (Unsigned)
Cardiology Office Note:    Date:  02/07/2020   ID:  Justin Mayer, DOB January 24, 1961, MRN [id number]  PCP:  Nonnie Done., MD  Cardiologist:  Norman Herrlich, MD    Referring MD: Nonnie Done., MD    ASSESSMENT:    No diagnosis found. PLAN:    In order of problems listed above:  1. ***   Next appointment: ***   Medication Adjustments/Labs and Tests Ordered: Current medicines are reviewed at length with the patient today.  Concerns regarding medicines are outlined above.  No orders of the defined types were placed in this encounter.  No orders of the defined types were placed in this encounter.   No chief complaint on file.   History of Present Illness:    Justin Mayer is a 59 y.o. male with a hx of pericarditis July 2020 hypertension last seen 12/20/2019 with chest pain.. Compliance with diet, lifestyle and medications: ***  Evaluation showed both C-reactive protein and sedimentation rate to be normal He underwent a cardiac CTA reported 02/04/2020 with a calcium score of 0 no CAD and a very small 4 mm subpleural nodule left lower lobe. Past Medical History:  Diagnosis Date  . Allergic rhinitis   . Arthritis   . Bacterial pneumonia 09/08/2018   (a.) during [facility] hospitalization  . Hypertension   . Hypokalemia 10/16/2018  . Lumbar degenerative disc disease   . Paronychia of toe, left 06/29/2018  . Paronychia, toe, right 06/29/2018  . Pericardial effusion 09/08/2018   (a.) during [facility] admission 09/08/18-09/10/18 in the setting of pericarditis  . Pericarditis 09/08/2018   (a.) during [facility] admission 09/08/18-09/10/18  . Sleep apnea     Past Surgical History:  Procedure Laterality Date  . CHOLECYSTECTOMY    . STRABISMUS SURGERY Bilateral 12/17/2016   Procedure: BILATERAL STRABISMUS REPAIR;  Surgeon: Verne Carrow, MD;  Location: [facility];  Service: Ophthalmology;  Laterality: Bilateral;    Current  Medications: No outpatient medications have been marked as taking for the 02/07/20 encounter (Appointment) with Baldo Daub, MD.     Allergies:   Patient has no known allergies.   Social History   Socioeconomic History  . Marital status: Married    Spouse name: Not on file  . Number of children: Not on file  . Years of education: Not on file  . Highest education level: Not on file  Occupational History  . Not on file  Tobacco Use  . Smoking status: Never Smoker  . Smokeless tobacco: Never Used  Vaping Use  . Vaping Use: Never used  Substance and Sexual Activity  . Alcohol use: No  . Drug use: No  . Sexual activity: Not on file  Other Topics Concern  . Not on file  Social History Narrative  . Not on file   Social Determinants of Health   Financial Resource Strain: Not on file  Food Insecurity: Not on file  Transportation Needs: Not on file  Physical Activity: Not on file  Stress: Not on file  Social Connections: Not on file     Family History: The patient's ***family history includes Valvular heart disease in his mother. ROS:   Please see the history of present illness.    All other systems reviewed and are negative.  EKGs/Labs/Other Studies Reviewed:    The following studies were reviewed today:  EKG:  EKG ordered today and personally reviewed.  The ekg ordered today demonstrates ***  Recent Labs:  12/20/2019: BUN 14; Creatinine, Ser 1.12; Hemoglobin 15.3; Platelets 217; Potassium 4.1; Sodium 141  Recent Lipid Panel    Component Value Date/Time   CHOL 148 12/20/2019 1629   TRIG 74 12/20/2019 1629   HDL 46 12/20/2019 1629   CHOLHDL 3.2 12/20/2019 1629   LDLCALC 87 12/20/2019 1629    Physical Exam:    VS:  There were no vitals taken for this visit.    Wt Readings from Last 3 Encounters:  12/20/19 260 lb (117.9 kg)  01/08/19 243 lb (110.2 kg)  10/16/18 232 lb 6.4 oz (105.4 kg)     GEN: *** Well nourished, well developed in no acute  distress HEENT: Normal NECK: No JVD; No carotid bruits LYMPHATICS: No lymphadenopathy CARDIAC: ***RRR, no murmurs, rubs, gallops RESPIRATORY:  Clear to auscultation without rales, wheezing or rhonchi  ABDOMEN: Soft, non-tender, non-distended MUSCULOSKELETAL:  No edema; No deformity  SKIN: Warm and dry NEUROLOGIC:  Alert and oriented x 3 PSYCHIATRIC:  Normal affect    Signed, Norman Herrlich, MD  02/07/2020 7:37 AM    [facility]

## 2020-03-27 ENCOUNTER — Ambulatory Visit: Payer: BC Managed Care – PPO | Admitting: Cardiology

## 2020-03-30 ENCOUNTER — Other Ambulatory Visit: Payer: Self-pay | Admitting: Cardiology

## 2020-03-30 DIAGNOSIS — I10 Essential (primary) hypertension: Secondary | ICD-10-CM

## 2020-03-31 NOTE — Telephone Encounter (Signed)
Rx refill sent to pharmacy. 

## 2020-04-13 NOTE — Progress Notes (Unsigned)
Cardiology Office Note:    Date:  04/14/2020   ID:  Justin Mayer, DOB 02-02-1961, MRN [id number]  PCP:  Nonnie Done., MD  Cardiologist:  Norman Herrlich, MD    Referring MD: Nonnie Done., MD    ASSESSMENT:    1. Idiopathic pericarditis, unspecified chronicity   2. Primary hypertension    PLAN:    In order of problems listed above:  1. Stable no evidence of clinical recurrence 2. 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  Concerns regarding medicines are outlined above.  No orders of the defined types were placed in this encounter.  No orders of the defined types were placed in this encounter.   Chief Complaint  Patient presents with  . Follow-up    After cardiac CTA     History of Present Illness:    Justin Mayer is a 60 y.o. 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  Compliance with diet, lifestyle and medications: Yes  I reviewed his CTA with him he is at very low cardiovascular risk for the next 7 years.  Blood pressure is borderline elevated repeat 130/90 and I changed him to ARB thiazide diuretic check renal function 2 weeks.  When cardiac CTA reported 02/04/2020 with a calcium score of 0 and no evidence of CAD.  Over read of the CT showed a small 4 mm nodule left lower lobe nonspecific.  He had normal inflammatory markers sedimentation rate and CRP were quite low and lipid profile was favorable with cholesterol 148 LDL 87 triglycerides 74 and HDL  46. Past Medical History:  Diagnosis Date  . Allergic rhinitis   . Arthritis   . Bacterial pneumonia 09/08/2018   (a.) during [facility] hospitalization  . Hypertension   . Hypokalemia 10/16/2018  . Lumbar degenerative disc disease   . Paronychia of toe, left 06/29/2018  . Paronychia, toe, right 06/29/2018  . Pericardial effusion 09/08/2018   (a.) during [facility] admission 09/08/18-09/10/18 in the setting of pericarditis  . Pericarditis 09/08/2018   (a.) during [facility] admission 09/08/18-09/10/18  . Sleep apnea     Past Surgical History:  Procedure Laterality Date  . CHOLECYSTECTOMY    . STRABISMUS SURGERY Bilateral 12/17/2016   Procedure: BILATERAL STRABISMUS REPAIR;  Surgeon: Verne Carrow, MD;  Location: [facility];  Service: Ophthalmology;  Laterality: Bilateral;    Current Medications: Current Meds  Medication Sig  . Ascorbic Acid (VITAMIN C) 1000 MG tablet Take 1,500 mg by mouth daily.  Marland Kitchen aspirin EC 81 MG tablet Take 81 mg by mouth daily.  Marland Kitchen atenolol (TENORMIN) 50 MG tablet Take 50 mg by mouth daily.  . cefdinir (OMNICEF) 300 MG capsule Take 300 mg by mouth.  . Cholecalciferol (VITAMIN D3) 10 MCG (400 UNIT) CHEW Chew 1 tablet by mouth daily.  Marland Kitchen GARLIC PO Take 1 tablet by mouth daily.  . Multiple Vitamins-Minerals (ZINC PO) Take 1 tablet by mouth daily.  . Omega-3 Fatty Acids (FISH OIL PO) Take 1  tablet by mouth 2 (two) times daily.  Marland Kitchen telmisartan (MICARDIS) 20 MG tablet TAKE 1 TABLET BY MOUTH EVERY DAY  . testosterone cypionate (DEPOTESTOSTERONE CYPIONATE) 200 MG/ML injection INJECT 1 ML EVERY 2 WEEKS  . Zinc 50 MG TABS Take 1 tablet by mouth daily.     Allergies:   Patient has no known allergies.   Social History   Socioeconomic History  . Marital status: Married    Spouse name: Not on file  . Number of children: Not on file  . Years of education: Not on file  . Highest education level: Not on file  Occupational History  . Not  on file  Tobacco Use  . Smoking status: Never Smoker  . Smokeless tobacco: Never Used  Vaping Use  . Vaping Use: Never used  Substance and Sexual Activity  . Alcohol use: No  . Drug use: No  . Sexual activity: Not on file  Other Topics Concern  . Not on file  Social History Narrative  . Not on file   Social Determinants of Health   Financial Resource Strain: Not on file  Food Insecurity: Not on file  Transportation Needs: Not on file  Physical Activity: Not on file  Stress: Not on file  Social Connections: Not on file     Family History: The patient's family history includes Valvular heart disease in his mother. ROS:   Please see the history of present illness.    All other systems reviewed and are negative.  EKGs/Labs/Other Studies Reviewed:    The following studies were reviewed today:   Recent Labs: 12/20/2019: BUN 14; Creatinine, Ser 1.12; Hemoglobin 15.3; Platelets 217; Potassium 4.1; Sodium 141  Recent Lipid Panel    Component Value Date/Time   CHOL 148 12/20/2019 1629   TRIG 74 12/20/2019 1629   HDL 46 12/20/2019 1629   CHOLHDL 3.2 12/20/2019 1629   LDLCALC 87 12/20/2019 1629    Physical Exam:    VS:  BP 130/90   Pulse 70   Ht 6' (1.829 m)   Wt 266 lb (120.7 kg)   SpO2 97%   BMI 36.08 kg/m     Wt Readings from Last 3 Encounters:  04/14/20 266 lb (120.7 kg)  12/20/19 260 lb (117.9 kg)  01/08/19 243 lb (110.2 kg)     GEN:  Well nourished, well developed in no acute distress HEENT: Normal NECK: No JVD; No carotid bruits LYMPHATICS: No lymphadenopathy CARDIAC: RRR, no murmurs, rubs, gallops RESPIRATORY:  Clear to auscultation without rales, wheezing or rhonchi  ABDOMEN: Soft, non-tender, non-distended MUSCULOSKELETAL:  No edema; No deformity  SKIN: Warm and dry NEUROLOGIC:  Alert and oriented x 3 PSYCHIATRIC:  Normal affect    Signed, Norman Herrlich, MD  04/14/2020 1:28 PM    [facility]

## 2020-04-14 ENCOUNTER — Other Ambulatory Visit: Payer: Self-pay

## 2020-04-14 ENCOUNTER — Encounter: Payer: Self-pay | Admitting: Cardiology

## 2020-04-14 ENCOUNTER — Ambulatory Visit: Payer: BC Managed Care – PPO | Admitting: Cardiology

## 2020-04-14 VITALS — BP 130/90 | HR 70 | Ht 72.0 in | Wt 266.0 lb

## 2020-04-14 DIAGNOSIS — I300 Acute nonspecific idiopathic pericarditis: Secondary | ICD-10-CM | POA: Diagnosis not present

## 2020-04-14 DIAGNOSIS — I10 Essential (primary) hypertension: Secondary | ICD-10-CM

## 2020-04-14 MED ORDER — TELMISARTAN-HCTZ 40-12.5 MG PO TABS: 0.5000 | ORAL_TABLET | Freq: Every day | ORAL | 3 refills | Status: DC

## 2020-04-14 NOTE — Patient Instructions (Addendum)
Medication Instructions:  Your physician has recommended you make the following change in your medication:  STOP: Telmisartan START: Telmisartan/HCTZ 40/12.5 mg take 1/2 tablet by mouth daily.   *If you need a refill on your cardiac medications before your next appointment, please call your pharmacy*   Lab Work: Your physician recommends that you return for lab work in: 2 WEEKS BMP If you have labs (blood work) drawn today and your tests are completely normal, you will receive your results only by: Marland Kitchen MyChart Message (if you have MyChart) OR . A paper copy in the mail If you have any lab test that is abnormal or we need to change your treatment, we will call you to review the results.   Testing/Procedures: None   Follow-Up: At [facility], you and your health needs are our priority.  As part of our continuing mission to provide you with exceptional heart care, we have created designated Provider Care Teams.  These Care Teams include your primary Cardiologist (physician) and Advanced Practice Providers (APPs -  Physician Assistants and Nurse Practitioners) who all work together to provide you with the care you need, when you need it.  We recommend signing up for the patient portal called "MyChart".  Sign up information is provided on this After Visit Summary.  MyChart is used to connect with patients for Virtual Visits (Telemedicine).  Patients are able to view lab/test results, encounter notes, upcoming appointments, etc.  Non-urgent messages can be sent to your provider as well.   To learn more about what you can do with MyChart, go to [email or web address].    Your next appointment:   As needed  The format for your next appointment:   In Person  Provider:   Norman Herrlich, MD   Other Instructions

## 2020-04-14 NOTE — Addendum Note (Signed)
Addended by: Delorse Limber I on: 04/14/2020 01:36 PM   Modules accepted: Orders

## 2020-07-07 ENCOUNTER — Other Ambulatory Visit: Payer: Self-pay | Admitting: Cardiology

## 2020-07-07 DIAGNOSIS — I10 Essential (primary) hypertension: Secondary | ICD-10-CM

## 2021-01-14 IMAGING — CT CT HEART MORP W/ CTA COR W/ SCORE W/ CA W/CM &/OR W/O CM
4 of 7 series · 8 of 20 positions shown, 9 images · non-contrast
Comparison: None.
COMPARISON: None.

Addendum:
EXAM:
OVER-READ INTERPRETATION  CT CHEST

The following report is an over-read performed by radiologist Dr.
Ardiantoro Plur [REDACTED] on 02/04/2020. This
over-read does not include interpretation of cardiac or coronary
anatomy or pathology. The coronary calcium score/coronary CTA
interpretation by the cardiologist is attached.
TECHNIQUE: The patient was scanned on a Siemens [REDACTED]ice scanner. Gantry
rotation speed was 250 msecs. Collimation was 0.6 mm. A 100 kV
prospective scan was triggered in the ascending thoracic aorta at
35-75% of the R-R interval. Average HR during the scan was 60 bpm.
The 3D data set was interpreted on a dedicated work station using
MPR, MIP and VRT modes. A total of 80cc of contrast was used.

[Series 6: best diast · axial · 0.39mm/px · z∈[-212,-167]mm · 2 of 338 slices shown, 3 images]
[im 113/338  vessel]
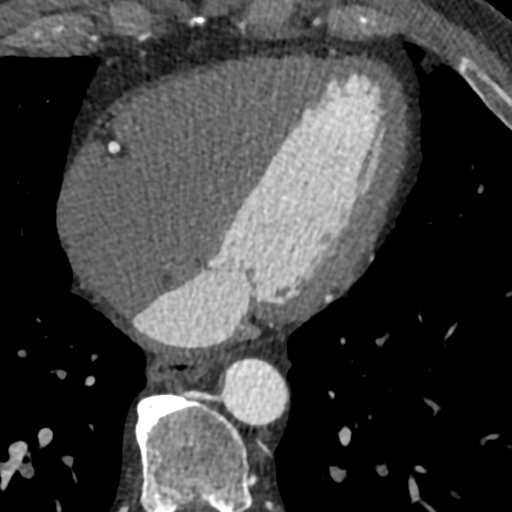
[im 113/338  lung]
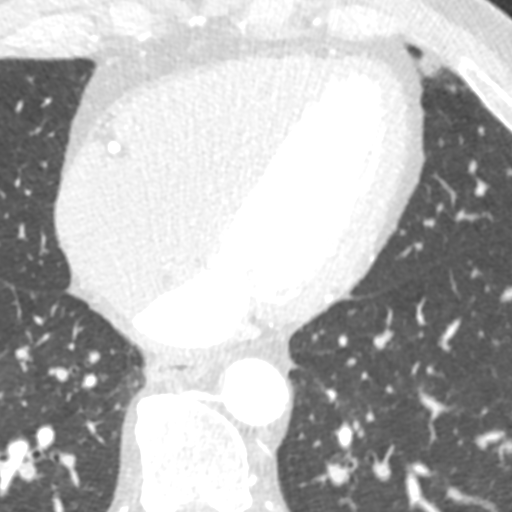
[im 225/338  vessel]
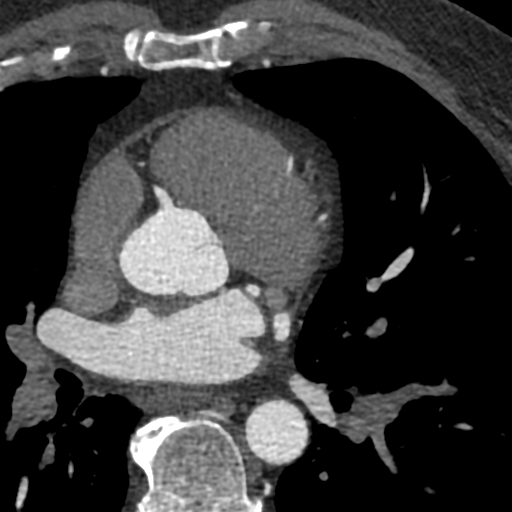

[Series 7: best syst · axial · 0.39mm/px · z∈[-212,-167]mm · 2 of 338 slices shown]
[im 113/338  vessel]
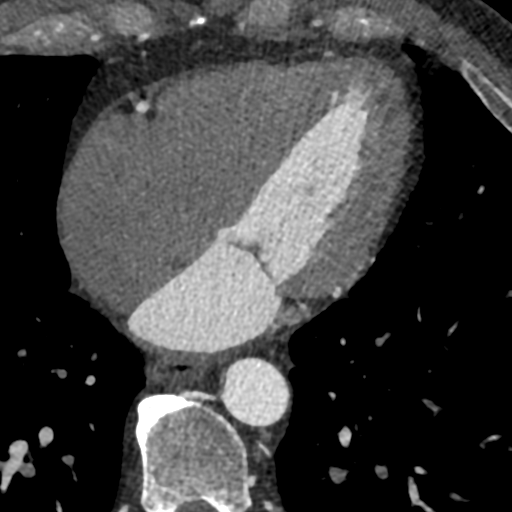
[im 225/338  vessel]
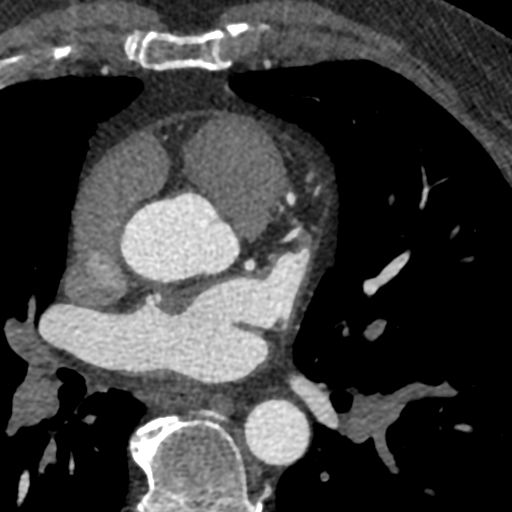

[Series 8: ts diast sharp · axial · 0.39mm/px · z∈[-212,-167]mm · 2 of 338 slices shown]
[im 113/338  lung]
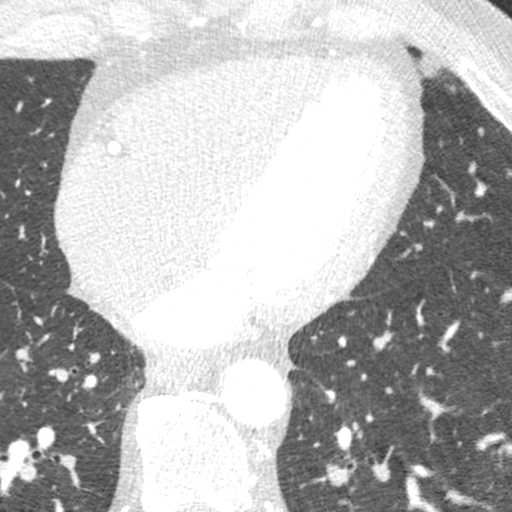
[im 225/338  lung]
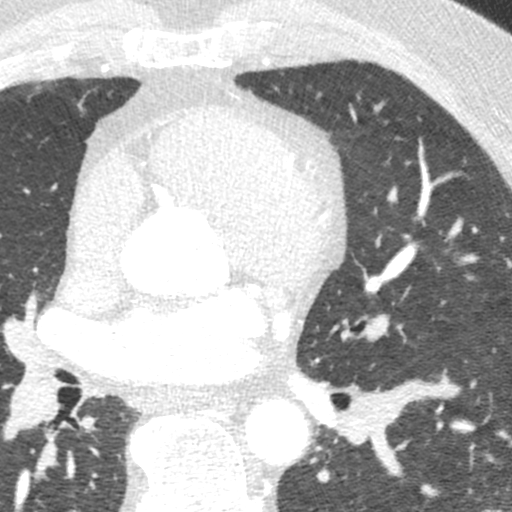

[Series 9: ts syst sharp · axial · 0.39mm/px · z∈[-212,-167]mm · 2 of 338 slices shown]
[im 113/338  lung]
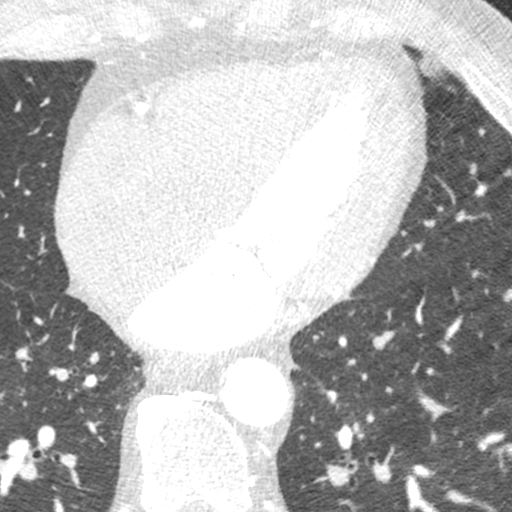
[im 225/338  lung]
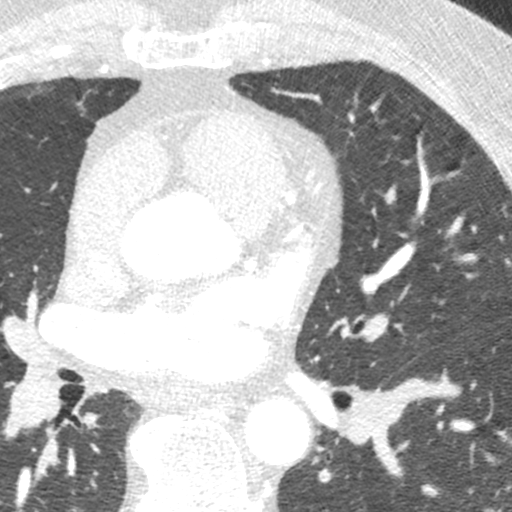

[8 of 20 positions shown; findings below may reference images not displayed]

FINDINGS: 4 mm subpleural nodule in the periphery of the left lower lobe
(axial image 21 of series 12), nonspecific, but statistically likely
a benign subpleural lymph node. Within the visualized portions of
the thorax there are no other suspicious appearing pulmonary nodules
or masses, there is no acute consolidative airspace disease, no
pleural effusions, no pneumothorax and no lymphadenopathy.
Visualized portions of the upper abdomen are unremarkable. There are
no aggressive appearing lytic or blastic lesions noted in the
visualized portions of the skeleton.
IMPRESSION: 1. 4 mm subpleural nodule in the periphery of the left lower lobe,
nonspecific, but statistically likely benign. No follow-up needed if
patient is low-risk. Non-contrast chest CT can be considered in 12
months if patient is high-risk. This recommendation follows the
consensus statement: Guidelines for Management of Incidental
Pulmonary Nodules Detected on CT Images: From the [REDACTED]AL DATA:  Chest pain

EXAM:
Cardiac CTA

MEDICATIONS:
Sub lingual nitro. 4mg x 2
FINDINGS: Non-cardiac: See separate report from [REDACTED].

Pulmonary veins drain normally to the left atrium. No LA appendage
thrombus noted.

Calcium Score: 0 Agatston units.

Coronary Arteries: Right dominant with no anomalies

LM: No plaque or stenosis.

LAD system:  No plaque or stenosis.

Circumflex system: No plaque or stenosis.

RCA system: No plaque or stenosis.
IMPRESSION: 1. Coronary artery calcium score 0 Agatston units, suggesting low
risk for future cardiac events.

2.  No significant coronary disease noted.

[employer]

*** End of Addendum ***
EXAM:
OVER-READ INTERPRETATION  CT CHEST

The following report is an over-read performed by radiologist Dr.
Ardiantoro Plur [REDACTED] on 02/04/2020. This
over-read does not include interpretation of cardiac or coronary
anatomy or pathology. The coronary calcium score/coronary CTA
interpretation by the cardiologist is attached.
FINDINGS: 4 mm subpleural nodule in the periphery of the left lower lobe
(axial image 21 of series 12), nonspecific, but statistically likely
a benign subpleural lymph node. Within the visualized portions of
the thorax there are no other suspicious appearing pulmonary nodules
or masses, there is no acute consolidative airspace disease, no
pleural effusions, no pneumothorax and no lymphadenopathy.
Visualized portions of the upper abdomen are unremarkable. There are
no aggressive appearing lytic or blastic lesions noted in the
visualized portions of the skeleton.
IMPRESSION: 1. 4 mm subpleural nodule in the periphery of the left lower lobe,
nonspecific, but statistically likely benign. No follow-up needed if
patient is low-risk. Non-contrast chest CT can be considered in 12
months if patient is high-risk. This recommendation follows the
consensus statement: Guidelines for Management of Incidental
Pulmonary Nodules Detected on CT Images: From the [HOSPITAL]

## 2021-04-06 ENCOUNTER — Other Ambulatory Visit: Payer: Self-pay | Admitting: Cardiology

## 2021-05-05 ENCOUNTER — Other Ambulatory Visit: Payer: Self-pay | Admitting: Cardiology

## 2021-05-30 ENCOUNTER — Other Ambulatory Visit: Payer: Self-pay | Admitting: Cardiology

## 2021-08-29 ENCOUNTER — Other Ambulatory Visit: Payer: Self-pay | Admitting: Cardiology

## 2021-09-15 ENCOUNTER — Other Ambulatory Visit: Payer: Self-pay | Admitting: Cardiology

## 2021-09-15 NOTE — Telephone Encounter (Signed)
Rx refill sent to pharmacy. 

## 2021-10-10 ENCOUNTER — Other Ambulatory Visit: Payer: Self-pay | Admitting: Cardiology

## 2021-11-03 ENCOUNTER — Other Ambulatory Visit: Payer: Self-pay | Admitting: Cardiology

## 2021-11-03 ENCOUNTER — Telehealth: Payer: Self-pay | Admitting: Cardiology

## 2021-11-03 MED ORDER — TELMISARTAN-HCTZ 40-12.5 MG PO TABS: 0.5000 | ORAL_TABLET | Freq: Every day | ORAL | 0 refills | Status: DC

## 2021-11-03 NOTE — Telephone Encounter (Signed)
*  STAT* If patient is at the pharmacy, call can be transferred to refill team.   1. Which medications need to be refilled? (please list name of each medication and dose if known)   telmisartan-hydrochlorothiazide (MICARDIS HCT) 40-12.5 MG tablet  2. Which pharmacy/location (including street and city if local pharmacy) is medication to be sent to?  [id number] - [place], [place] - [address]  3. Do they need a 30 day or 90 day supply? 90 day  Patient stated that he is completely out of this medication.

## 2021-12-27 ENCOUNTER — Other Ambulatory Visit: Payer: Self-pay | Admitting: Cardiology

## 2021-12-28 NOTE — Telephone Encounter (Signed)
Refill sent with message for patient needs appointment for future refills and pharmacy to contact patients pcp for refills / final attempt
